# Patient Record
Sex: Male | Born: 1970 | Race: Black or African American | Hispanic: No | Marital: Married | Smoking: Never smoker
Health system: Southern US, Community
[De-identification: ages and names within clinical notes are randomized; demographics above are authoritative.]

## PROBLEM LIST (undated history)

## (undated) DIAGNOSIS — I428 Other cardiomyopathies: Secondary | ICD-10-CM

## (undated) DIAGNOSIS — R739 Hyperglycemia, unspecified: Secondary | ICD-10-CM

## (undated) DIAGNOSIS — E785 Hyperlipidemia, unspecified: Secondary | ICD-10-CM

## (undated) DIAGNOSIS — N182 Chronic kidney disease, stage 2 (mild): Secondary | ICD-10-CM

## (undated) HISTORY — DX: Chronic kidney disease, stage 2 (mild): N18.2

## (undated) HISTORY — DX: Hyperglycemia, unspecified: R73.9

## (undated) HISTORY — DX: Hyperlipidemia, unspecified: E78.5

## (undated) HISTORY — DX: Other cardiomyopathies: I42.8

---

## 2013-02-18 ENCOUNTER — Encounter (HOSPITAL_COMMUNITY): Payer: Self-pay | Admitting: Emergency Medicine

## 2013-02-18 ENCOUNTER — Emergency Department (HOSPITAL_COMMUNITY): Payer: Managed Care, Other (non HMO)

## 2013-02-18 ENCOUNTER — Emergency Department (HOSPITAL_COMMUNITY)
Admission: EM | Admit: 2013-02-18 | Discharge: 2013-02-18 | Disposition: A | Discharge status: Home / Self Care | Payer: Managed Care, Other (non HMO) | Attending: Emergency Medicine | Admitting: Emergency Medicine

## 2013-02-18 DIAGNOSIS — N289 Disorder of kidney and ureter, unspecified: Secondary | ICD-10-CM | POA: Insufficient documentation

## 2013-02-18 DIAGNOSIS — R079 Chest pain, unspecified: Secondary | ICD-10-CM

## 2013-02-18 LAB — POCT I-STAT TROPONIN I: Troponin i, poc: 0.01 ng/mL (ref 0.00–0.08)

## 2013-02-18 LAB — CBC WITH DIFFERENTIAL/PLATELET
Basophils Absolute: 0 10*3/uL (ref 0.0–0.1)
Lymphocytes Relative: 44 % (ref 12–46)
Lymphs Abs: 3.7 10*3/uL (ref 0.7–4.0)
Neutro Abs: 4 10*3/uL (ref 1.7–7.7)
Neutrophils Relative %: 47 % (ref 43–77)
Platelets: 316 10*3/uL (ref 150–400)
RBC: 5.38 MIL/uL (ref 4.22–5.81)
RDW: 12.5 % (ref 11.5–15.5)
WBC: 8.4 10*3/uL (ref 4.0–10.5)

## 2013-02-18 LAB — POCT I-STAT, CHEM 8
BUN: 12 mg/dL (ref 6–23)
Chloride: 102 mEq/L (ref 96–112)
Potassium: 4.9 mEq/L (ref 3.5–5.1)
Sodium: 140 mEq/L (ref 135–145)
TCO2: 28 mmol/L (ref 0–100)

## 2013-02-18 NOTE — ED Provider Notes (Addendum)
CSN: [id number]     Arrival date & time 02/18/13  1209 History     First MD Initiated Contact with Patient 02/18/13 1223     Chief Complaint  Patient presents with  . Chest Pain   (Consider location/radiation/quality/duration/timing/severity/associated sxs/prior Treatment) Patient is a 42 y.o. male presenting with chest pain. The history is provided by the patient.  Chest Pain  patient's been having episodes of sharp left-sided chest pain that lasted a few seconds for the last 3 days. It does not come on exertion. He initially states that he thought he pulled a muscle. He states that he was not able to reproduce it with exercise. No fevers. No cough.  No past medical history on file. No past surgical history on file. No family history on file. History  Substance Use Topics  . Smoking status: Not on file  . Smokeless tobacco: Not on file  . Alcohol Use: Not on file    Review of Systems  Cardiovascular: Positive for chest pain.    Allergies  Review of patient's allergies indicates no known allergies.  Home Medications   Current Outpatient Rx  Name  Route  Sig  Dispense  Refill  . Glucosamine-Chondroitin (OSTEO BI-FLEX REGULAR STRENGTH PO)   Oral   Take 2 capsules by mouth daily.          BP 129/85  Pulse 64  Temp(Src) 98.2 F (36.8 C) (Oral)  Resp 17  Ht 6' (1.829 m)  Wt 283 lb (128.368 kg)  BMI 38.37 kg/m2  SpO2 99% Physical Exam  Nursing note and vitals reviewed. Constitutional: He is oriented to person, place, and time. He appears well-developed and well-nourished.  HENT:  Head: Normocephalic and atraumatic.  Eyes: EOM are normal. Pupils are equal, round, and reactive to light.  Neck: Normal range of motion. Neck supple.  Cardiovascular: Normal rate, regular rhythm and normal heart sounds.   No murmur heard. Pulmonary/Chest: Effort normal and breath sounds normal. He exhibits tenderness.  Mild left-sided chest tenderness. No crepitus deformity. No rash.   Abdominal: Soft. Bowel sounds are normal. He exhibits no distension and no mass. There is no tenderness. There is no rebound and no guarding.  Musculoskeletal: Normal range of motion. He exhibits no edema.  Neurological: He is alert and oriented to person, place, and time. No cranial nerve deficit.  Skin: Skin is warm and dry.  Psychiatric: He has a normal mood and affect.    ED Course   Procedures (including critical care time)  Labs Reviewed  POCT I-STAT, CHEM 8 - Abnormal; Notable for the following:    Creatinine, Ser 1.40 (*)    Glucose, Bld 101 (*)    All other components within normal limits  CBC WITH DIFFERENTIAL  POCT I-STAT TROPONIN I   Dg Chest 2 View  02/18/2013   *RADIOLOGY REPORT*  Clinical Data: 42 year old male with chest pain  CHEST - 2 VIEW  Comparison: None  Findings: The cardiomediastinal silhouette is unremarkable. There is no evidence of focal airspace disease, pulmonary edema, suspicious pulmonary nodule/mass, pleural effusion, or pneumothorax. No acute bony abnormalities are identified.  IMPRESSION: No evidence of active cardiopulmonary disease.   Original Report Authenticated By: Harmon Pier, M.D.   1. Chest pain   2. Renal insufficiency    Date: 02/18/2013  Rate: 70  Rhythm: normal sinus rhythm  QRS Axis: normal  Intervals: normal  ST/T Wave abnormalities: normal  Conduction Disutrbances: none  Narrative Interpretation: unremarkable     MDM  Patient with episodes of sharp left-sided chest pain. Lasts a second or 2. EKG is normal and lab works reassuring. Doubt cardiac cause or pulmonary embolism. She does have mild renal sufficiency to be followed. Did have one episode where he felt lightheaded yesterday. Will likely need cardiology followup.  Juliet Rude. Rubin Payor, MD 02/18/13 1610  Juliet Rude. Rubin Payor, MD 03/05/13 1447

## 2013-02-18 NOTE — ED Notes (Signed)
Phlebotomy in the room

## 2013-02-18 NOTE — ED Notes (Signed)
Cp started on Friday sharp  Neck hurt no n/v/sob got lightheaded on Sunday went to dr today and  Sent to er for further tests

## 2013-02-18 NOTE — ED Notes (Signed)
Pt presents to the ED after being told by PCP to come here.  The patient reports that Friday, he began to feel sudden chest discomfort that radiated to his back and bilateral shoulders at rest.  Denies sob, n/v, diaphoresis at that time.  He reported another episode on Sunday where he felt suddenly lightheaded like he was going to pass out for about 15 seconds, pt was at rest and playing with his son at the time.  Pt denies any sob, n/v, diaphoresis at that time.  Pt is not experiencing any symptoms at this time.

## 2014-02-04 DIAGNOSIS — M19019 Primary osteoarthritis, unspecified shoulder: Secondary | ICD-10-CM | POA: Insufficient documentation

## 2014-02-04 DIAGNOSIS — M719 Bursopathy, unspecified: Secondary | ICD-10-CM

## 2014-02-04 DIAGNOSIS — M67919 Unspecified disorder of synovium and tendon, unspecified shoulder: Secondary | ICD-10-CM | POA: Insufficient documentation

## 2014-07-21 ENCOUNTER — Observation Stay (HOSPITAL_COMMUNITY)
Admission: EM | Admit: 2014-07-21 | Discharge: 2014-07-23 | Disposition: A | Discharge status: Home / Self Care | Payer: Managed Care, Other (non HMO) | Attending: Cardiovascular Disease | Admitting: Cardiovascular Disease

## 2014-07-21 ENCOUNTER — Encounter (HOSPITAL_COMMUNITY): Payer: Self-pay | Admitting: Family Medicine

## 2014-07-21 DIAGNOSIS — E785 Hyperlipidemia, unspecified: Secondary | ICD-10-CM | POA: Diagnosis present

## 2014-07-21 DIAGNOSIS — I429 Cardiomyopathy, unspecified: Secondary | ICD-10-CM | POA: Diagnosis not present

## 2014-07-21 DIAGNOSIS — R9431 Abnormal electrocardiogram [ECG] [EKG]: Secondary | ICD-10-CM | POA: Diagnosis present

## 2014-07-21 DIAGNOSIS — I428 Other cardiomyopathies: Secondary | ICD-10-CM

## 2014-07-21 DIAGNOSIS — I10 Essential (primary) hypertension: Secondary | ICD-10-CM | POA: Insufficient documentation

## 2014-07-21 DIAGNOSIS — Z7982 Long term (current) use of aspirin: Secondary | ICD-10-CM | POA: Insufficient documentation

## 2014-07-21 DIAGNOSIS — R079 Chest pain, unspecified: Secondary | ICD-10-CM | POA: Diagnosis present

## 2014-07-21 DIAGNOSIS — Z8249 Family history of ischemic heart disease and other diseases of the circulatory system: Secondary | ICD-10-CM | POA: Insufficient documentation

## 2014-07-21 LAB — I-STAT TROPONIN, ED: TROPONIN I, POC: 0 ng/mL (ref 0.00–0.08)

## 2014-07-21 LAB — BASIC METABOLIC PANEL
Anion gap: 8 (ref 5–15)
BUN: 11 mg/dL (ref 6–23)
CO2: 27 mmol/L (ref 19–32)
Calcium: 9.3 mg/dL (ref 8.4–10.5)
Chloride: 102 mEq/L (ref 96–112)
Creatinine, Ser: 1.34 mg/dL (ref 0.50–1.35)
GFR calc Af Amer: 74 mL/min — ABNORMAL LOW (ref >90)
GFR, EST NON AFRICAN AMERICAN: 64 mL/min — AB (ref >90)
GLUCOSE: 95 mg/dL (ref 70–99)
Potassium: 4.1 mmol/L (ref 3.5–5.1)
Sodium: 137 mmol/L (ref 135–145)

## 2014-07-21 LAB — CBC
HEMATOCRIT: 41.3 % (ref 39.0–52.0)
Hemoglobin: 13.6 g/dL (ref 13.0–17.0)
MCH: 27 pg (ref 26.0–34.0)
MCHC: 32.9 g/dL (ref 30.0–36.0)
MCV: 82.1 fL (ref 78.0–100.0)
Platelets: 339 10*3/uL (ref 150–400)
RBC: 5.03 MIL/uL (ref 4.22–5.81)
RDW: 12.8 % (ref 11.5–15.5)
WBC: 8.6 10*3/uL (ref 4.0–10.5)

## 2014-07-21 LAB — TROPONIN I: Troponin I: <0.03 ng/mL (ref <0.031)

## 2014-07-21 MED ORDER — SODIUM CHLORIDE 0.9 % IJ SOLN
3.0000 mL | Freq: Two times a day (BID) | INTRAMUSCULAR | Status: DC
Start: 2014-07-21 — End: 2014-07-22
  Administered 2014-07-22: 3 mL via INTRAVENOUS

## 2014-07-21 MED ORDER — SODIUM CHLORIDE 0.9 % IJ SOLN: 3.0000 mL | INTRAMUSCULAR | Status: DC | PRN

## 2014-07-21 MED ORDER — ACETAMINOPHEN 325 MG PO TABS: 650.0000 mg | ORAL_TABLET | ORAL | Status: DC | PRN

## 2014-07-21 MED ORDER — SODIUM CHLORIDE 0.9 % IV SOLN
INTRAVENOUS | Status: DC
  Administered 2014-07-22: 06:00:00 via INTRAVENOUS

## 2014-07-21 MED ORDER — ASPIRIN EC 81 MG PO TBEC
81.0000 mg | DELAYED_RELEASE_TABLET | Freq: Every day | ORAL | Status: DC
  Administered 2014-07-22: 81 mg via ORAL
  Filled 2014-07-21: qty 1

## 2014-07-21 MED ORDER — NITROGLYCERIN 0.4 MG SL SUBL: 0.4000 mg | SUBLINGUAL_TABLET | SUBLINGUAL | Status: DC | PRN

## 2014-07-21 MED ORDER — HEPARIN BOLUS VIA INFUSION
4000.0000 [IU] | Freq: Once | INTRAVENOUS | Status: AC
  Administered 2014-07-21: 4000 [IU] via INTRAVENOUS
  Filled 2014-07-21: qty 4000

## 2014-07-21 MED ORDER — SODIUM CHLORIDE 0.9 % IV SOLN: 250.0000 mL | INTRAVENOUS | Status: DC | PRN

## 2014-07-21 MED ORDER — ONDANSETRON HCL 4 MG/2ML IJ SOLN
4.0000 mg | Freq: Four times a day (QID) | INTRAMUSCULAR | Status: DC | PRN
Start: 2014-07-21 — End: 2014-07-22

## 2014-07-21 MED ORDER — HEPARIN (PORCINE) IN NACL 100-0.45 UNIT/ML-% IJ SOLN
1450.0000 [IU]/h | INTRAMUSCULAR | Status: DC
  Administered 2014-07-21 – 2014-07-22 (×2): 1450 [IU]/h via INTRAVENOUS
  Filled 2014-07-21 (×2): qty 250

## 2014-07-21 MED ORDER — ASPIRIN 81 MG PO CHEW
324.0000 mg | CHEWABLE_TABLET | Freq: Once | ORAL | Status: AC
  Administered 2014-07-21: 324 mg via ORAL
  Filled 2014-07-21: qty 4

## 2014-07-21 MED ORDER — ASPIRIN 81 MG PO CHEW: 324.0000 mg | CHEWABLE_TABLET | ORAL | Status: DC

## 2014-07-21 MED ORDER — ASPIRIN 81 MG PO CHEW
81.0000 mg | CHEWABLE_TABLET | ORAL | Status: AC
  Administered 2014-07-22: 81 mg via ORAL
  Filled 2014-07-21: qty 1

## 2014-07-21 MED ORDER — ASPIRIN 300 MG RE SUPP: 300.0000 mg | RECTAL | Status: DC

## 2014-07-21 NOTE — ED Notes (Addendum)
Pt presents from PCP office via [facility] with c/o dull intermittent CP x1 week. Pt reports he is recovering a recent cold/cough and saw his PCP to address the CP, EKG performed at the PCP office showed "Ischemic changes in the inferior leads" EMS reports their EKG did not show these changes. Pt denies CP at this time - pt states that pain is not made worse or better by anything. Pt A&Ox4 in NAD. Pt was given 3SL Nitro at PCP office.

## 2014-07-21 NOTE — ED Notes (Signed)
Patient now reporting mild discomfort to left chest, repeat EKG performed per Dr. Fayrene Fearing' request,

## 2014-07-21 NOTE — Progress Notes (Signed)
ANTICOAGULATION CONSULT NOTE - Initial Consult  Pharmacy Consult for Heparin Indication: ACS/STEMI  Allergies  Allergen Reactions  . Meloxicam Other (See Comments)    Stomach problems-gastritis  . Etodolac Nausea Only    Patient Measurements: Height: 6' (182.9 cm) Weight: 277 lb (125.646 kg) IBW/kg (Calculated) : 77.6 Heparin Dosing Weight: 106 kg  Vital Signs: Temp: 98.5 F (36.9 C) (01/18 1547) Temp Source: Oral (01/18 1547) BP: 112/67 mmHg (01/18 1645) Pulse Rate: 72 (01/18 1645)  Labs:  Recent Labs  07/21/14 1553  HGB 13.6  HCT 41.3  PLT 339  CREATININE 1.34    Estimated Creatinine Clearance: 98.3 mL/min (by C-G formula based on Cr of 1.34).   Medical History: Past Medical History  Diagnosis Date  . Hyperlipidemia     Medications:   (Not in a hospital admission) Scheduled:    Assessment:  78 YOM w/ PMH of HLD presenting to [facility] on 07/21/14 c/o intermittent CP ongoing for the past week.  Seen by PCP 07/21/14, EKG revealed ischemic changes and given 3 NTG via primary care. Pharmacy has been consulted to dose heparin for ACS/STEMI.  H/H wnl, plt wnl.  Troponin neg.  Scr wnl.  No prior anticoagulation use.    Goal of Therapy:  Heparin level 0.3-0.7 units/ml Monitor platelets by anticoagulation protocol: Yes   Plan:  - Heparin 4000 unit IV bolus x 1 - Heparin 1450 units/hr IV infusion - Follow up heparin level 6 hours after start of infusion - Daily CBC and Heparin level - Monitor signs and symptoms of bleeding  Red Christians, Pharm. D. Clinical Pharmacy Resident Pager: [phone] Ph: [phone] 07/21/2014 6:08 PM

## 2014-07-21 NOTE — ED Provider Notes (Signed)
Date: 07/21/2014  Rate: 75  Rhythm: normal sinus rhythm  QRS Axis: normal  Intervals: normal  ST/T Wave abnormalities: normal  Conduction Disutrbances:none  Narrative Interpretation:   Old EKG Reviewed: none available     Rolland Porter, MD 07/21/14 1552

## 2014-07-21 NOTE — Progress Notes (Signed)
Chaplain paged for AD. Philip Bruce given AD, nature of document explained to pt.   Gala Romney, Chaplain 07/21/2014

## 2014-07-21 NOTE — ED Provider Notes (Signed)
CSN: [id number]     Arrival date & time 07/21/14  1535 History   First MD Initiated Contact with Patient 07/21/14 1540     Chief Complaint  Patient presents with  . Chest Pain     (Consider location/radiation/quality/duration/timing/severity/associated sxs/prior Treatment) HPI Comments: Patient presents emergency department with chief complaint of chest pain. He states symptoms have been ongoing for the past week. They are intermittent. He describes pain as a dull ache. Denies any radiating symptoms to his arm or jaw. She denies any associated shortness of breath or exertional symptoms. Asian states that he was seen by his PCP today, and had an EKG, which showed ischemic changes in the inferior leads. The EKG changes have resolved in the emergency department. Patient is pain-free now. He was given 3 nitroglycerin by primary care. He denies any cardiac history. Denies history of hypertension, diabetes, hyperlipidemia, or smoking history. Denies any history of PE or DVT. Additionally, patient states that he has had an upper respiratory infection for the past couple of weeks. He states that he is finally starting to get over this.  The history is provided by the patient. No language interpreter was used.    Past Medical History  Diagnosis Date  . Hyperlipidemia    History reviewed. No pertinent past surgical history. No family history on file. History  Substance Use Topics  . Smoking status: Passive Smoke Exposure - Never Smoker  . Smokeless tobacco: Not on file  . Alcohol Use: Not on file    Review of Systems  Constitutional: Negative for fever and chills.  Respiratory: Negative for shortness of breath.   Cardiovascular: Positive for chest pain.  Gastrointestinal: Negative for nausea, vomiting, diarrhea and constipation.  Genitourinary: Negative for dysuria.  All other systems reviewed and are negative.     Allergies  Meloxicam and Etodolac  Home Medications   Prior to  Admission medications   Medication Sig Start Date End Date Taking? Authorizing Provider  etodolac (LODINE) 400 MG tablet Take 400 mg by mouth as needed for moderate pain.    Yes Historical Provider, MD  Protein POWD Take 1 application by mouth daily as needed (FOR WORKOUT).   Yes Historical Provider, MD   BP 121/72 mmHg  Pulse 73  Temp(Src) 98.5 F (36.9 C) (Oral)  Resp 18  Ht 6' (1.829 m)  Wt 277 lb (125.646 kg)  BMI 37.56 kg/m2  SpO2 97% Physical Exam  Constitutional: He is oriented to person, place, and time. He appears well-developed and well-nourished.  HENT:  Head: Normocephalic and atraumatic.  Eyes: Conjunctivae and EOM are normal. Pupils are equal, round, and reactive to light. Right eye exhibits no discharge. Left eye exhibits no discharge. No scleral icterus.  Neck: Normal range of motion. Neck supple. No JVD present.  Cardiovascular: Normal rate, regular rhythm and normal heart sounds.  Exam reveals no gallop and no friction rub.   No murmur heard. Pulmonary/Chest: Effort normal and breath sounds normal. No respiratory distress. He has no wheezes. He has no rales. He exhibits no tenderness.  Abdominal: Soft. He exhibits no distension and no mass. There is no tenderness. There is no rebound and no guarding.  Musculoskeletal: Normal range of motion. He exhibits no edema or tenderness.  Neurological: He is alert and oriented to person, place, and time.  Skin: Skin is warm and dry.  Psychiatric: He has a normal mood and affect. His behavior is normal. Judgment and thought content normal.  Nursing note and vitals reviewed.  ED Course  Procedures (including critical care time) Results for orders placed or performed during the hospital encounter of 07/21/14  CBC  Result Value Ref Range   WBC 8.6 4.0 - 10.5 K/uL   RBC 5.03 4.22 - 5.81 MIL/uL   Hemoglobin 13.6 13.0 - 17.0 g/dL   HCT 74.1 28.7 - 86.7 %   MCV 82.1 78.0 - 100.0 fL   MCH 27.0 26.0 - 34.0 pg   MCHC 32.9 30.0  - 36.0 g/dL   RDW 67.2 09.4 - 70.9 %   Platelets 339 150 - 400 K/uL  Basic metabolic panel  Result Value Ref Range   Sodium 137 135 - 145 mmol/L   Potassium 4.1 3.5 - 5.1 mmol/L   Chloride 102 96 - 112 mEq/L   CO2 27 19 - 32 mmol/L   Glucose, Bld 95 70 - 99 mg/dL   BUN 11 6 - 23 mg/dL   Creatinine, Ser 6.28 0.50 - 1.35 mg/dL   Calcium 9.3 8.4 - 36.6 mg/dL   GFR calc non Af Amer 64 (L) >90 mL/min   GFR calc Af Amer 74 (L) >90 mL/min   Anion gap 8 5 - 15  I-stat troponin, ED (not at [facility])  Result Value Ref Range   Troponin i, poc 0.00 0.00 - 0.08 ng/mL   Comment 3           No results found.    EKG Interpretation None     See attached note from Dr. Fayrene Fearing for EKG. MDM   Final diagnoses:  Chest pain, unspecified chest pain type    Patient with intermittent chest pain 1 week. EKG at PCP office shows ST depression in the inferior leads. This has resolved on our EKG in the emergency department. Given new dynamic EKG changes consider observation admission. Troponin is negative. Patient seen by and discussed with Dr. Fayrene Fearing, who agrees with the plan.  Patient discussed by Dr. Fayrene Fearing with cardiology, who recommends heparin and admission.  Cardiology to see patient.  Medications  heparin ADULT infusion 100 units/mL (25000 units/250 mL) (not administered)  heparin bolus via infusion 4,000 Units (not administered)  aspirin chewable tablet 324 mg (324 mg Oral Given 07/21/14 1619)    CRITICAL CARE Performed by: Roxy Horseman   Total critical care time: 35  Critical care time was exclusive of separately billable procedures and treating other patients.  Critical care was necessary to treat or prevent imminent or life-threatening deterioration.  Critical care was time spent personally by me on the following activities: development of treatment plan with patient and/or surrogate as well as nursing, discussions with consultants, evaluation of patient's response to treatment,  examination of patient, obtaining history from patient or surrogate, ordering and performing treatments and interventions, ordering and review of laboratory studies, ordering and review of radiographic studies, pulse oximetry and re-evaluation of patient's condition.    Roxy Horseman, PA-C 07/21/14 1753  Rolland Porter, MD 07/26/14 [phone]

## 2014-07-21 NOTE — ED Provider Notes (Signed)
Patient seen and evaluated. Care discussed with Ivar Drape PA. Patient reports intermittent episodes of "discomfort" in his left chest for the last several days. Describes as "small balloon blowing up in my chest and then deflating". No associated nausea shortness of breath no radiation neck back or jaw. EKG is primary care physician shows ST depressions and inverted T waves. Arrival EKG here shows only inverted T waves. He is symptom-free here and has a normal first troponin.  Discussed by myself with Dr. Thersa Salt. [name] He recommends heparin bolus and drip. Patient has been given aspirin. He remains symptom-free.  Rolland Porter, MD 07/21/14 1736

## 2014-07-21 NOTE — H&P (Signed)
Patient ID: Philip Bruce MRN: [id number], DOB/AGE: 09-04-1970   Admit date: 07/21/2014   Primary Physician: Pcp Not In System Primary Cardiologist: New (Dr. Eden Emms)  Pt. Profile: 44 y/o male with no prior cardiac history presenting to ED with intermittent CP x 1 week   Problem List  Past Medical History  Diagnosis Date  . Hyperlipidemia     History reviewed. No pertinent past surgical history.   Allergies  Allergies  Allergen Reactions  . Meloxicam Other (See Comments)    Stomach problems-gastritis  . Etodolac Nausea Only    HPI: The patient is a 44 year old African-American male, with no prior cardiac history, presenting to the [facility] emergency department for evaluation for chest pain. He denies any prior history of hypertension, diabetes or tobacco use. He reports prior history of hyperlipidemia but has been intolerant to statin therapy. He has been attempting to keep levels controlled through diet and exercise.  He notes a strong family history of CAD, noting myocardial infarction in his mother while in her 25s to 79s. He is followed by [facility]. He reports being seen by a cardiologist more than 15 years ago for evaluation of palpitations. He wore a heart monitor for a brief period of time but findings were benign.  He was in his usual state of health until one week ago. He developed URI symptoms consistent with a common cold. He noted  generalized malaise, fatigue and a productive cough but denied fever, chills, nausea or vomiting. It was during this time when he also developed intermittent left-sided chest pain radiating to his back. Described as a dull/pressure-like sensation. He denies radiation to his upper extremities, neck or jaw. No associated dyspnea, dizziness, lightheadedness, palpitations, syncope/near-syncope, nausea/vomiting. The pain has been slightly pleuritic, worse with deep breathing and coughing. He denies any exacerbation with palpation  or positional changes. His pain is not worsened by exertion. 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 He has not had any difficulties/limitations with this job duties due to increased pain. However,  due to ongoing symptoms, he presented to [facility] today for evaluation. While there, an EKG was obtained revealing inferior ST depressions. Interestingly, he reports that he was chest pain-free at the time the EKG was obtained. However, given his recent history of chest pain, he was subsequently referred to the ER for further evaluation. Repeat EKGs were obtained in the ED and previous EKG changes have resolved. There has been some questionable ST elevations, felt to be probable repolarization abnormalities. Vital signs including heart rate and blood pressure have remained stable. CBC and BMP are both unremarkable. Point of care troponin is negative. Chest x-ray not yet obtained.   Home Medications  Prior to Admission medications   Medication Sig Start Date End Date Taking? Authorizing Provider  etodolac (LODINE) 400 MG tablet Take 400 mg by mouth as needed for moderate pain.    Yes Historical Provider, MD  Protein POWD Take 1 application by mouth daily as needed (FOR WORKOUT).   Yes Historical Provider, MD    Family History  Family History  Problem Relation Age of Onset  . Coronary artery disease Mother 42    MI    Social History  History   Social History  . Marital Status: Married    Spouse Name: N/A    Number of Children: N/A  . Years of Education: N/A   Occupational History  . Not on file.   Social History Main  Topics  . Smoking status: Passive Smoke Exposure - Never Smoker  . Smokeless tobacco: Not on file  . Alcohol Use: Not on file  . Drug Use: Not on file  . Sexual Activity: Not on file   Other Topics Concern  . Not on file   Social History Narrative     Review of Systems General:  No chills, fever, night sweats or weight  changes.  Cardiovascular:  No chest pain, dyspnea on exertion, edema, orthopnea, palpitations, paroxysmal nocturnal dyspnea. Dermatological: No rash, lesions/masses Respiratory: No cough, dyspnea Urologic: No hematuria, dysuria Abdominal:   No nausea, vomiting, diarrhea, bright red blood per rectum, melena, or hematemesis Neurologic:  No visual changes, wkns, changes in mental status. All other systems reviewed and are otherwise negative except as noted above.  Physical Exam  Blood pressure 116/80, pulse 69, temperature 98.5 F (36.9 C), temperature source Oral, resp. rate 14, height 6' (1.829 m), weight 277 lb (125.646 kg), SpO2 97 %.  General: Pleasant, NAD Psych: Normal affect. Neuro: Alert and oriented X 3. Moves all extremities spontaneously. HEENT: Normal  Neck: Supple without bruits or JVD. Lungs:  Resp regular and unlabored, CTA. Heart: RRR no s3, s4, or murmurs. Abdomen: Soft, non-tender, non-distended, BS + x 4.  Extremities: No clubbing, cyanosis or edema. DP/PT/Radials 2+ and equal bilaterally.  Labs  Troponin [facility] of Care Test)  Recent Labs  07/21/14 1604  TROPIPOC 0.00   No results for input(s): CKTOTAL, CKMB, TROPONINI in the last 72 hours. Lab Results  Component Value Date   WBC 8.6 07/21/2014   HGB 13.6 07/21/2014   HCT 41.3 07/21/2014   MCV 82.1 07/21/2014   PLT 339 07/21/2014     Recent Labs Lab 07/21/14 1553  NA 137  K 4.1  CL 102  CO2 27  BUN 11  CREATININE 1.34  CALCIUM 9.3  GLUCOSE 95   No results found for: CHOL, HDL, LDLCALC, TRIG No results found for: DDIMER   Radiology/Studies  No results found.  ECG  Inferior ST depressions on earlier EKG. Most recent with NSR.   Echocardiogram      ASSESSMENT AND PLAN  Principal Problem:   Chest pain with moderate risk for cardiac etiology  1. Chest pain with moderate risk for cardiac etiology: Patient has had intermittent left-sided chest pain radiating to his back x 1 week.  Positive cardiac risk factors include a strong family history of CAD as well as a personal history of hyperlipidemia. EKG obtained at urgent care earlier today demonstrated ischemic changes in the inferior leads. The EKG changes have resolved in the ED. He is now chest pain-free. Given his EKG abnormalities, symptomatology and risk factors, we will admit and will plan for diagnostic left heart catheterization in the a.m. Will continue to cycle cardiac enzymes. Will place on IV heparin and aspirin. We will hold initiation of beta blocker therapy for now given his heart rate is in the 60s. Will make NPO starting at midnight.  2. History of hyperlipidemia: He reports he was on statin therapy over a year ago however this was discontinued due to intolerance. Since that time, he has attempted to control levels through diet and exercise. We'll obtain a fasting lipid panel in the a.m. to ensure that levels are adequately controlled.    Beau Fanny, PA-C 07/21/2014, 6:14 PM  Patient examined chart reviewed.  Symptoms are somewhat atypical beginning with URI but persistant with no focal infectious signs.  ECG from urgent care worrisome  with focal inferior T wave inversions in 2,3, F that are transient with his pain.  Recurrent pain in ER.  Elevated lipids and family history  Discussed options and feel cath is indicated.  Start heparin ASA.  Risks including stoke bleeding and MI discussed willing to proceed.  Orders written and lab notified.   Exam remarkable for overweight black male.  Some right shoulder rotator cuff pain. No rub distant heart sounds  Charlton Haws

## 2014-07-21 NOTE — ED Notes (Signed)
Attempted report 

## 2014-07-22 ENCOUNTER — Encounter (HOSPITAL_COMMUNITY)
Admission: EM | Disposition: A | Discharge status: Home / Self Care | Payer: Self-pay | Source: Home / Self Care | Attending: Emergency Medicine

## 2014-07-22 ENCOUNTER — Encounter (HOSPITAL_COMMUNITY): Payer: Self-pay | Admitting: Cardiology

## 2014-07-22 DIAGNOSIS — I200 Unstable angina: Secondary | ICD-10-CM

## 2014-07-22 DIAGNOSIS — R079 Chest pain, unspecified: Secondary | ICD-10-CM | POA: Diagnosis present

## 2014-07-22 DIAGNOSIS — E785 Hyperlipidemia, unspecified: Secondary | ICD-10-CM | POA: Diagnosis present

## 2014-07-22 HISTORY — PX: LEFT HEART CATHETERIZATION WITH CORONARY ANGIOGRAM: SHX5451

## 2014-07-22 LAB — PROTIME-INR
INR: 1.11 (ref 0.00–1.49)
PROTHROMBIN TIME: 14.4 s (ref 11.6–15.2)

## 2014-07-22 LAB — CBC
HCT: 43.5 % (ref 39.0–52.0)
Hemoglobin: 14.2 g/dL (ref 13.0–17.0)
MCH: 27.3 pg (ref 26.0–34.0)
MCHC: 32.6 g/dL (ref 30.0–36.0)
MCV: 83.7 fL (ref 78.0–100.0)
Platelets: 352 10*3/uL (ref 150–400)
RBC: 5.2 MIL/uL (ref 4.22–5.81)
RDW: 12.8 % (ref 11.5–15.5)
WBC: 10.6 10*3/uL — AB (ref 4.0–10.5)

## 2014-07-22 LAB — BASIC METABOLIC PANEL
Anion gap: 3 — ABNORMAL LOW (ref 5–15)
BUN: 14 mg/dL (ref 6–23)
CHLORIDE: 99 meq/L (ref 96–112)
CO2: 33 mmol/L — AB (ref 19–32)
CREATININE: 1.27 mg/dL (ref 0.50–1.35)
Calcium: 8.8 mg/dL (ref 8.4–10.5)
GFR calc non Af Amer: 68 mL/min — ABNORMAL LOW (ref >90)
GFR, EST AFRICAN AMERICAN: 79 mL/min — AB (ref >90)
Glucose, Bld: 121 mg/dL — ABNORMAL HIGH (ref 70–99)
Potassium: 3.4 mmol/L — ABNORMAL LOW (ref 3.5–5.1)
SODIUM: 135 mmol/L (ref 135–145)

## 2014-07-22 LAB — TROPONIN I: Troponin I: <0.03 ng/mL (ref <0.031)

## 2014-07-22 LAB — LIPID PANEL
CHOLESTEROL: 231 mg/dL — AB (ref 0–200)
HDL: 38 mg/dL — ABNORMAL LOW (ref >39)
LDL Cholesterol: 125 mg/dL — ABNORMAL HIGH (ref 0–99)
TRIGLYCERIDES: 338 mg/dL — AB (ref <150)
Total CHOL/HDL Ratio: 6.1 RATIO
VLDL: 68 mg/dL — ABNORMAL HIGH (ref 0–40)

## 2014-07-22 LAB — HEPARIN LEVEL (UNFRACTIONATED)
HEPARIN UNFRACTIONATED: 0.36 [IU]/mL (ref 0.30–0.70)
Heparin Unfractionated: 0.42 IU/mL (ref 0.30–0.70)

## 2014-07-22 SURGERY — LEFT HEART CATHETERIZATION WITH CORONARY ANGIOGRAM

## 2014-07-22 MED ORDER — NITROGLYCERIN 1 MG/10 ML FOR IR/CATH LAB
INTRA_ARTERIAL | Status: AC
  Filled 2014-07-22: qty 10

## 2014-07-22 MED ORDER — POTASSIUM CHLORIDE CRYS ER 20 MEQ PO TBCR
40.0000 meq | EXTENDED_RELEASE_TABLET | Freq: Once | ORAL | Status: AC
  Administered 2014-07-22: 40 meq via ORAL
  Filled 2014-07-22: qty 2

## 2014-07-22 MED ORDER — HEPARIN (PORCINE) IN NACL 2-0.9 UNIT/ML-% IJ SOLN
INTRAMUSCULAR | Status: AC
  Filled 2014-07-22: qty 1000

## 2014-07-22 MED ORDER — HEPARIN SODIUM (PORCINE) 5000 UNIT/ML IJ SOLN
5000.0000 [IU] | Freq: Three times a day (TID) | INTRAMUSCULAR | Status: DC
  Administered 2014-07-22 – 2014-07-23 (×2): 5000 [IU] via SUBCUTANEOUS
  Filled 2014-07-22 (×2): qty 1

## 2014-07-22 MED ORDER — HEPARIN SODIUM (PORCINE) 1000 UNIT/ML IJ SOLN
INTRAMUSCULAR | Status: AC
  Filled 2014-07-22: qty 1

## 2014-07-22 MED ORDER — LIDOCAINE HCL (PF) 1 % IJ SOLN
INTRAMUSCULAR | Status: AC
  Filled 2014-07-22: qty 30

## 2014-07-22 MED ORDER — FENTANYL CITRATE 0.05 MG/ML IJ SOLN
INTRAMUSCULAR | Status: AC
  Filled 2014-07-22: qty 2

## 2014-07-22 MED ORDER — SODIUM CHLORIDE 0.9 % IV SOLN
1.0000 mL/kg/h | INTRAVENOUS | Status: AC
  Administered 2014-07-22: 1 mL/kg/h via INTRAVENOUS

## 2014-07-22 MED ORDER — MIDAZOLAM HCL 2 MG/2ML IJ SOLN
INTRAMUSCULAR | Status: AC
  Filled 2014-07-22: qty 2

## 2014-07-22 MED ORDER — VERAPAMIL HCL 2.5 MG/ML IV SOLN
INTRAVENOUS | Status: AC
  Filled 2014-07-22: qty 2

## 2014-07-22 MED ORDER — MORPHINE SULFATE 2 MG/ML IJ SOLN: 2.0000 mg | INTRAMUSCULAR | Status: DC | PRN

## 2014-07-22 NOTE — Research (Signed)
Bioflow Study Informed Consent   Subject Name: Philip Bruce  Subject met inclusion and exclusion criteria.  The informed consent form, study requirements and expectations were reviewed with the subject and questions and concerns were addressed prior to the signing of the consent form.  The subject verbalized understanding of the trail requirements.  The subject agreed to participate in the Bioflow trial and signed the informed consent.  The informed consent was obtained prior to performance of any protocol-specific procedures for the subject.  A copy of the signed informed consent was given to the subject and a copy was placed in the subject's medical record.  Sandie Ano 07/22/2014, 9:12

## 2014-07-22 NOTE — H&P (View-Only) (Signed)
  DAILY PROGRESS NOTE  Subjective:  No further chest pain overnight. Troponin negative x 3. Plan for LHC today. Cholesterol is markedly elevated.  Potassium is low today at 3.4.  Objective:  Temp:  [97.8 F (36.6 C)-98.5 F (36.9 C)] 97.8 F (36.6 C) (01/19 0555) Pulse Rate:  [53-74] 53 (01/19 0555) Resp:  [12-20] 18 (01/19 0555) BP: (108-128)/(67-95) 108/76 mmHg (01/19 0555) SpO2:  [92 %-100 %] 99 % (01/19 0555) Weight:  [276 lb 3.2 oz (125.283 kg)-277 lb (125.646 kg)] 276 lb 11.2 oz (125.51 kg) (01/19 0555) Weight change:   Intake/Output from previous day:    Intake/Output from this shift: Total I/O In: 3 [I.V.:3] Out: -   Medications: Current Facility-Administered Medications  Medication Dose Route Frequency Provider Last Rate Last Dose  . 0.9 %  sodium chloride infusion  250 mL Intravenous PRN Brittainy M Simmons, PA-C      . 0.9 %  sodium chloride infusion   Intravenous Continuous Brittainy M Simmons, PA-C 100 mL/hr at 07/22/14 0530    . acetaminophen (TYLENOL) tablet 650 mg  650 mg Oral Q4H PRN Brittainy M Simmons, PA-C      . aspirin chewable tablet 324 mg  324 mg Oral NOW Brittainy M Simmons, PA-C   324 mg at 07/21/14 2000   Or  . aspirin suppository 300 mg  300 mg Rectal NOW Brittainy M Simmons, PA-C      . aspirin EC tablet 81 mg  81 mg Oral Daily Brittainy M Simmons, PA-C   81 mg at 07/22/14 0847  . heparin ADULT infusion 100 units/mL (25000 units/250 mL)  1,450 Units/hr Intravenous Continuous Samson C Lee, RPH 14.5 mL/hr at 07/22/14 0847 1,450 Units/hr at 07/22/14 0847  . nitroGLYCERIN (NITROSTAT) SL tablet 0.4 mg  0.4 mg Sublingual Q5 Min x 3 PRN Brittainy M Simmons, PA-C      . ondansetron (ZOFRAN) injection 4 mg  4 mg Intravenous Q6H PRN Brittainy M Simmons, PA-C      . sodium chloride 0.9 % injection 3 mL  3 mL Intravenous Q12H Brittainy M Simmons, PA-C   3 mL at 07/22/14 0848  . sodium chloride 0.9 % injection 3 mL  3 mL Intravenous PRN Brittainy M  Simmons, PA-C        Physical Exam: General appearance: alert and no distress Lungs: clear to auscultation bilaterally Heart: regular rate and rhythm, S1, S2 normal, no murmur, click, rub or gallop Extremities: extremities normal, atraumatic, no cyanosis or edema  Lab Results: Results for orders placed or performed during the hospital encounter of 07/21/14 (from the past 48 hour(s))  CBC     Status: None   Collection Time: 07/21/14  3:53 PM  Result Value Ref Range   WBC 8.6 4.0 - 10.5 K/uL   RBC 5.03 4.22 - 5.81 MIL/uL   Hemoglobin 13.6 13.0 - 17.0 g/dL   HCT 41.3 39.0 - 52.0 %   MCV 82.1 78.0 - 100.0 fL   MCH 27.0 26.0 - 34.0 pg   MCHC 32.9 30.0 - 36.0 g/dL   RDW 12.8 11.5 - 15.5 %   Platelets 339 150 - 400 K/uL  Basic metabolic panel     Status: Abnormal   Collection Time: 07/21/14  3:53 PM  Result Value Ref Range   Sodium 137 135 - 145 mmol/L    Comment: Please note change in reference range.   Potassium 4.1 3.5 - 5.1 mmol/L    Comment: Please note change in reference   range.   Chloride 102 96 - 112 mEq/L   CO2 27 19 - 32 mmol/L   Glucose, Bld 95 70 - 99 mg/dL   BUN 11 6 - 23 mg/dL   Creatinine, Ser 1.34 0.50 - 1.35 mg/dL   Calcium 9.3 8.4 - 10.5 mg/dL   GFR calc non Af Amer 64 (L) >90 mL/min   GFR calc Af Amer 74 (L) >90 mL/min    Comment: (NOTE) The eGFR has been calculated using the CKD EPI equation. This calculation has not been validated in all clinical situations. eGFR's persistently <90 mL/min signify possible Chronic Kidney Disease.    Anion gap 8 5 - 15  I-stat troponin, ED (not at [facility])     Status: None   Collection Time: 07/21/14  4:04 PM  Result Value Ref Range   Troponin i, poc 0.00 0.00 - 0.08 ng/mL   Comment 3            Comment: Due to the release kinetics of cTnI, a negative result within the first hours of the onset of symptoms does not rule out myocardial infarction with certainty. If myocardial infarction is still suspected, repeat the test  at appropriate intervals.   Troponin I (q 6hr x 3)     Status: None   Collection Time: 07/21/14  6:50 PM  Result Value Ref Range   Troponin I <0.03 <0.031 ng/mL    Comment:        NO INDICATION OF MYOCARDIAL INJURY. Please note change in reference range.   Heparin level (unfractionated)     Status: None   Collection Time: 07/22/14 12:32 AM  Result Value Ref Range   Heparin Unfractionated 0.36 0.30 - 0.70 IU/mL    Comment:        IF HEPARIN RESULTS ARE BELOW EXPECTED VALUES, AND PATIENT DOSAGE HAS BEEN CONFIRMED, SUGGEST FOLLOW UP TESTING OF ANTITHROMBIN III LEVELS.   CBC     Status: Abnormal   Collection Time: 07/22/14 12:32 AM  Result Value Ref Range   WBC 10.6 (H) 4.0 - 10.5 K/uL   RBC 5.20 4.22 - 5.81 MIL/uL   Hemoglobin 14.2 13.0 - 17.0 g/dL   HCT 43.5 39.0 - 52.0 %   MCV 83.7 78.0 - 100.0 fL   MCH 27.3 26.0 - 34.0 pg   MCHC 32.6 30.0 - 36.0 g/dL   RDW 12.8 11.5 - 15.5 %   Platelets 352 150 - 400 K/uL  Troponin I (q 6hr x 3)     Status: None   Collection Time: 07/22/14 12:32 AM  Result Value Ref Range   Troponin I <0.03 <0.031 ng/mL    Comment:        NO INDICATION OF MYOCARDIAL INJURY. Please note change in reference range.   Protime-INR     Status: None   Collection Time: 07/22/14 12:32 AM  Result Value Ref Range   Prothrombin Time 14.4 11.6 - 15.2 seconds   INR 1.11 0.00 - 1.49  Basic metabolic panel     Status: Abnormal   Collection Time: 07/22/14 12:32 AM  Result Value Ref Range   Sodium 135 135 - 145 mmol/L    Comment: Please note change in reference range.   Potassium 3.4 (L) 3.5 - 5.1 mmol/L    Comment: Please note change in reference range. DELTA CHECK NOTED    Chloride 99 96 - 112 mEq/L   CO2 33 (H) 19 - 32 mmol/L   Glucose, Bld 121 (H) 70 -   99 mg/dL   BUN 14 6 - 23 mg/dL   Creatinine, Ser 1.27 0.50 - 1.35 mg/dL   Calcium 8.8 8.4 - 10.5 mg/dL   GFR calc non Af Amer 68 (L) >90 mL/min   GFR calc Af Amer 79 (L) >90 mL/min    Comment:  (NOTE) The eGFR has been calculated using the CKD EPI equation. This calculation has not been validated in all clinical situations. eGFR's persistently <90 mL/min signify possible Chronic Kidney Disease.    Anion gap 3 (L) 5 - 15  Lipid panel     Status: Abnormal   Collection Time: 07/22/14 12:32 AM  Result Value Ref Range   Cholesterol 231 (H) 0 - 200 mg/dL   Triglycerides 338 (H) <150 mg/dL   HDL 38 (L) >39 mg/dL   Total CHOL/HDL Ratio 6.1 RATIO   VLDL 68 (H) 0 - 40 mg/dL   LDL Cholesterol 125 (H) 0 - 99 mg/dL    Comment:        Total Cholesterol/HDL:CHD Risk Coronary Heart Disease Risk Table                     Men   Women  1/2 Average Risk   3.4   3.3  Average Risk       5.0   4.4  2 X Average Risk   9.6   7.1  3 X Average Risk  23.4   11.0        Use the calculated Patient Ratio above and the CHD Risk Table to determine the patient's CHD Risk.        ATP III CLASSIFICATION (LDL):  <100     mg/dL   Optimal  100-129  mg/dL   Near or Above                    Optimal  130-159  mg/dL   Borderline  160-189  mg/dL   High  >190     mg/dL   Very High   Troponin I (q 6hr x 3)     Status: None   Collection Time: 07/22/14  6:19 AM  Result Value Ref Range   Troponin I <0.03 <0.031 ng/mL    Comment:        NO INDICATION OF MYOCARDIAL INJURY. Please note change in reference range.   Heparin level (unfractionated)     Status: None   Collection Time: 07/22/14  6:19 AM  Result Value Ref Range   Heparin Unfractionated 0.42 0.30 - 0.70 IU/mL    Comment:        IF HEPARIN RESULTS ARE BELOW EXPECTED VALUES, AND PATIENT DOSAGE HAS BEEN CONFIRMED, SUGGEST FOLLOW UP TESTING OF ANTITHROMBIN III LEVELS.     Imaging: No results found.  Assessment:  Principal Problem:   Chest pain with moderate risk for cardiac etiology Active Problems:   Dyslipidemia   Plan:  1. No further chest pain overnight. Troponin negative x 3. 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 May need to reconsider statin, zetia or other options for treatment, mostly dependant on cath findings. Potassium repleted today.  Time Spent Directly with Patient:  15 minutes  Length of Stay:  LOS: 1 day   Pixie Casino, MD, [facility] Attending Cardiologist [facility]  HILTY,Kenneth C 07/22/2014, 10:17 AM

## 2014-07-22 NOTE — Progress Notes (Signed)
UR Completed.  [phone]  

## 2014-07-22 NOTE — Progress Notes (Signed)
  DAILY PROGRESS NOTE  Subjective:  No further chest pain overnight. Troponin negative x 3. Plan for LHC today. Cholesterol is markedly elevated.  Potassium is low today at 3.4.  Objective:  Temp:  [97.8 F (36.6 C)-98.5 F (36.9 C)] 97.8 F (36.6 C) (01/19 0555) Pulse Rate:  [53-74] 53 (01/19 0555) Resp:  [12-20] 18 (01/19 0555) BP: (108-128)/(67-95) 108/76 mmHg (01/19 0555) SpO2:  [92 %-100 %] 99 % (01/19 0555) Weight:  [276 lb 3.2 oz (125.283 kg)-277 lb (125.646 kg)] 276 lb 11.2 oz (125.51 kg) (01/19 0555) Weight change:   Intake/Output from previous day:    Intake/Output from this shift: Total I/O In: 3 [I.V.:3] Out: -   Medications: Current Facility-Administered Medications  Medication Dose Route Frequency Provider Last Rate Last Dose  . 0.9 %  sodium chloride infusion  250 mL Intravenous PRN Brittainy M Simmons, PA-C      . 0.9 %  sodium chloride infusion   Intravenous Continuous Brittainy M Simmons, PA-C 100 mL/hr at 07/22/14 0530    . acetaminophen (TYLENOL) tablet 650 mg  650 mg Oral Q4H PRN Brittainy M Simmons, PA-C      . aspirin chewable tablet 324 mg  324 mg Oral NOW Brittainy M Simmons, PA-C   324 mg at 07/21/14 2000   Or  . aspirin suppository 300 mg  300 mg Rectal NOW Brittainy M Simmons, PA-C      . aspirin EC tablet 81 mg  81 mg Oral Daily Brittainy M Simmons, PA-C   81 mg at 07/22/14 0847  . heparin ADULT infusion 100 units/mL (25000 units/250 mL)  1,450 Units/hr Intravenous Continuous Samson C Lee, RPH 14.5 mL/hr at 07/22/14 0847 1,450 Units/hr at 07/22/14 0847  . nitroGLYCERIN (NITROSTAT) SL tablet 0.4 mg  0.4 mg Sublingual Q5 Min x 3 PRN Brittainy M Simmons, PA-C      . ondansetron (ZOFRAN) injection 4 mg  4 mg Intravenous Q6H PRN Brittainy M Simmons, PA-C      . sodium chloride 0.9 % injection 3 mL  3 mL Intravenous Q12H Brittainy M Simmons, PA-C   3 mL at 07/22/14 0848  . sodium chloride 0.9 % injection 3 mL  3 mL Intravenous PRN Brittainy M  Simmons, PA-C        Physical Exam: General appearance: alert and no distress Lungs: clear to auscultation bilaterally Heart: regular rate and rhythm, S1, S2 normal, no murmur, click, rub or gallop Extremities: extremities normal, atraumatic, no cyanosis or edema  Lab Results: Results for orders placed or performed during the hospital encounter of 07/21/14 (from the past 48 hour(s))  CBC     Status: None   Collection Time: 07/21/14  3:53 PM  Result Value Ref Range   WBC 8.6 4.0 - 10.5 K/uL   RBC 5.03 4.22 - 5.81 MIL/uL   Hemoglobin 13.6 13.0 - 17.0 g/dL   HCT 41.3 39.0 - 52.0 %   MCV 82.1 78.0 - 100.0 fL   MCH 27.0 26.0 - 34.0 pg   MCHC 32.9 30.0 - 36.0 g/dL   RDW 12.8 11.5 - 15.5 %   Platelets 339 150 - 400 K/uL  Basic metabolic panel     Status: Abnormal   Collection Time: 07/21/14  3:53 PM  Result Value Ref Range   Sodium 137 135 - 145 mmol/L    Comment: Please note change in reference range.   Potassium 4.1 3.5 - 5.1 mmol/L    Comment: Please note change in reference   range.   Chloride 102 96 - 112 mEq/L   CO2 27 19 - 32 mmol/L   Glucose, Bld 95 70 - 99 mg/dL   BUN 11 6 - 23 mg/dL   Creatinine, Ser 1.34 0.50 - 1.35 mg/dL   Calcium 9.3 8.4 - 10.5 mg/dL   GFR calc non Af Amer 64 (L) >90 mL/min   GFR calc Af Amer 74 (L) >90 mL/min    Comment: (NOTE) The eGFR has been calculated using the CKD EPI equation. This calculation has not been validated in all clinical situations. eGFR's persistently <90 mL/min signify possible Chronic Kidney Disease.    Anion gap 8 5 - 15  I-stat troponin, ED (not at [facility])     Status: None   Collection Time: 07/21/14  4:04 PM  Result Value Ref Range   Troponin i, poc 0.00 0.00 - 0.08 ng/mL   Comment 3            Comment: Due to the release kinetics of cTnI, a negative result within the first hours of the onset of symptoms does not rule out myocardial infarction with certainty. If myocardial infarction is still suspected, repeat the test  at appropriate intervals.   Troponin I (q 6hr x 3)     Status: None   Collection Time: 07/21/14  6:50 PM  Result Value Ref Range   Troponin I <0.03 <0.031 ng/mL    Comment:        NO INDICATION OF MYOCARDIAL INJURY. Please note change in reference range.   Heparin level (unfractionated)     Status: None   Collection Time: 07/22/14 12:32 AM  Result Value Ref Range   Heparin Unfractionated 0.36 0.30 - 0.70 IU/mL    Comment:        IF HEPARIN RESULTS ARE BELOW EXPECTED VALUES, AND PATIENT DOSAGE HAS BEEN CONFIRMED, SUGGEST FOLLOW UP TESTING OF ANTITHROMBIN III LEVELS.   CBC     Status: Abnormal   Collection Time: 07/22/14 12:32 AM  Result Value Ref Range   WBC 10.6 (H) 4.0 - 10.5 K/uL   RBC 5.20 4.22 - 5.81 MIL/uL   Hemoglobin 14.2 13.0 - 17.0 g/dL   HCT 43.5 39.0 - 52.0 %   MCV 83.7 78.0 - 100.0 fL   MCH 27.3 26.0 - 34.0 pg   MCHC 32.6 30.0 - 36.0 g/dL   RDW 12.8 11.5 - 15.5 %   Platelets 352 150 - 400 K/uL  Troponin I (q 6hr x 3)     Status: None   Collection Time: 07/22/14 12:32 AM  Result Value Ref Range   Troponin I <0.03 <0.031 ng/mL    Comment:        NO INDICATION OF MYOCARDIAL INJURY. Please note change in reference range.   Protime-INR     Status: None   Collection Time: 07/22/14 12:32 AM  Result Value Ref Range   Prothrombin Time 14.4 11.6 - 15.2 seconds   INR 1.11 0.00 - 1.49  Basic metabolic panel     Status: Abnormal   Collection Time: 07/22/14 12:32 AM  Result Value Ref Range   Sodium 135 135 - 145 mmol/L    Comment: Please note change in reference range.   Potassium 3.4 (L) 3.5 - 5.1 mmol/L    Comment: Please note change in reference range. DELTA CHECK NOTED    Chloride 99 96 - 112 mEq/L   CO2 33 (H) 19 - 32 mmol/L   Glucose, Bld 121 (H) 70 -   99 mg/dL   BUN 14 6 - 23 mg/dL   Creatinine, Ser 1.27 0.50 - 1.35 mg/dL   Calcium 8.8 8.4 - 10.5 mg/dL   GFR calc non Af Amer 68 (L) >90 mL/min   GFR calc Af Amer 79 (L) >90 mL/min    Comment:  (NOTE) The eGFR has been calculated using the CKD EPI equation. This calculation has not been validated in all clinical situations. eGFR's persistently <90 mL/min signify possible Chronic Kidney Disease.    Anion gap 3 (L) 5 - 15  Lipid panel     Status: Abnormal   Collection Time: 07/22/14 12:32 AM  Result Value Ref Range   Cholesterol 231 (H) 0 - 200 mg/dL   Triglycerides 338 (H) <150 mg/dL   HDL 38 (L) >39 mg/dL   Total CHOL/HDL Ratio 6.1 RATIO   VLDL 68 (H) 0 - 40 mg/dL   LDL Cholesterol 125 (H) 0 - 99 mg/dL    Comment:        Total Cholesterol/HDL:CHD Risk Coronary Heart Disease Risk Table                     Men   Women  1/2 Average Risk   3.4   3.3  Average Risk       5.0   4.4  2 X Average Risk   9.6   7.1  3 X Average Risk  23.4   11.0        Use the calculated Patient Ratio above and the CHD Risk Table to determine the patient's CHD Risk.        ATP III CLASSIFICATION (LDL):  <100     mg/dL   Optimal  100-129  mg/dL   Near or Above                    Optimal  130-159  mg/dL   Borderline  160-189  mg/dL   High  >190     mg/dL   Very High   Troponin I (q 6hr x 3)     Status: None   Collection Time: 07/22/14  6:19 AM  Result Value Ref Range   Troponin I <0.03 <0.031 ng/mL    Comment:        NO INDICATION OF MYOCARDIAL INJURY. Please note change in reference range.   Heparin level (unfractionated)     Status: None   Collection Time: 07/22/14  6:19 AM  Result Value Ref Range   Heparin Unfractionated 0.42 0.30 - 0.70 IU/mL    Comment:        IF HEPARIN RESULTS ARE BELOW EXPECTED VALUES, AND PATIENT DOSAGE HAS BEEN CONFIRMED, SUGGEST FOLLOW UP TESTING OF ANTITHROMBIN III LEVELS.     Imaging: No results found.  Assessment:  Principal Problem:   Chest pain with moderate risk for cardiac etiology Active Problems:   Dyslipidemia   Plan:  1. No further chest pain overnight. Troponin negative x 3. 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 May need to reconsider statin, zetia or other options for treatment, mostly dependant on cath findings. Potassium repleted today.  Time Spent Directly with Patient:  15 minutes  Length of Stay:  LOS: 1 day   Pixie Casino, MD, [facility] Attending Cardiologist [facility]  Yexalen Deike C 07/22/2014, 10:17 AM

## 2014-07-22 NOTE — CV Procedure (Signed)
CARDIAC CATHETERIZATION REPORT  NAME:  Philip Bruce   MRN: [id number] DOB:  [birth year]   ADMIT DATE: 07/21/2014 Procedure Date: 07/22/2014  INTERVENTIONAL CARDIOLOGIST: Leonie Man, M.D., MS PRIMARY CARE PROVIDER: Pcp Not In System PRIMARY CARDIOLOGIST:  Jenkins Rouge, M.D.  PATIENT:  Philip Bruce is a 44 y.o. male with a history of hypertension and hyperlipidemia and strong family history for CAD who was admitted on January 18 worsening episodes of intermittent chest pain.  His mother had an MI in her Olevia Bowens to 69s. His symptoms noted as chest heaviness and pressure, associated with upper respiratory tract infection symptoms. EKG in the emergency room showed concerning for dynamic inferior T-wave inversions and therefore is referred for cardiac catheterization for possible unstable angina.  PRE-OPERATIVE DIAGNOSIS:    Chest Pain with Concern for Unstable Angina  PROCEDURES PERFORMED:    Left Heart Catheterization with Native Coronary Angiography  via Right Radial Artery   Left Ventriculography  PROCEDURE: The patient was brought to the 2nd [place] [facility] in the fasting state and prepped and draped in the usual sterile fashion for right radial artery access. A modified Allen's test was performed on the right wrist demonstrating excellent collateral flow for radial access.   Sterile technique was used including antiseptics, cap, gloves, gown, hand hygiene, mask and sheet. Skin prep: Chlorhexidine.   Consent: Risks of procedure as well as the alternatives and risks of each were explained to the (patient/caregiver). Consent for procedure obtained.   Time Out: Verified patient identification, verified procedure, site/side was marked, verified correct patient position, special equipment/implants available, medications/allergies/relevent history reviewed, required imaging and test results available. Performed.  Access:   Right Radial Artery: 6 Fr Sheath -   Seldinger Technique (Angiocath Micropuncture Kit)  Radial Cocktail - 10 mL; IV Heparin 6500 Units   Left Heart Catheterization: 5 Fr Catheters advanced or exchanged over a long exchange safety J-wire; TIG 4.0 catheter advanced first.  Left and Right Coronary Artery Cineangiography: TIG 4.0 Catheter   LV Hemodynamics (LV Gram): Angled pigtail  Sheath removed in the cardiac Cath Lab with TR band placement for hemostasis.  TR Band: 1300  Hours; 12 mL air  FINDINGS:  Hemodynamics:   Central Aortic Pressure / Mean: 107/72/87 mmHg  Left Ventricular Pressure / LVEDP: 111/0/11 mmHg  Left Ventriculography:  EF: Roughly 45 %  Wall Motion: Global hypokinesis, mild  Coronary Anatomy:  Dominance: Right  Left Main: Large-caliber vessel bifurcates into the LAD and Circumflex. Angiographically normal. LAD: Normal caliber vessel tapers down to the apex. It gives rise to a mid vessel diagonal branch. Both parent and daughter vessel are free of significant disease. The LAD does not reach around the apex.  Left Circumflex: Large-caliber, nondominant vessel. After giving off a small AV groove branch that terminates as a large lateral OM with a mid branch followed by distal bifurcation into 2 moderate caliber branches. Angiographically normal.    RCA: Normal caliber, dominant vessel that bifurcates distally into the Right Posterior Descending Artery (RPDA) and the Right Posterior AV Groove Branch (RPAV)  RPDA: Moderate caliber vessel that tapers to the apex. Angiographically normal.  RPL Sysytem:The RPAV moderate large-caliber vessel that essentially terminates as a large extensive posterolateral branch. Angiographic normal.  MEDICATIONS:  Anesthesia:  Local Lidocaine 2 ml  Sedation:  2 mg IV Versed, 50 mcg IV fentanyl ;   Omnipaque Contrast: 80 ml  Anticoagulation:  IV Heparin 6500 Units Radial Cocktail: 5 mg Verapamil, 400 mcg  NTG, 2 ml 2% Lidocaine in 10 ml NS  PATIENT DISPOSITION:     The patient was transferred to the PACU holding area in a hemodynamicaly stable, chest pain free condition.  The patient tolerated the procedure well, and there were no complications.  EBL:   < 10 ml  The patient was stable before, during, and after the procedure.  POST-OPERATIVE DIAGNOSIS:    Angiographically normal coronary arteries with mildly reduced EF  Normal LV filling pressures.  PLAN OF CARE:  Transfer back to nursing unit for post radial cath care  Check 2-D echocardiogram for better estimation of EF  Anticipate discharge later on this afternoon following echocardiogram as long as it is not grossly abnormal.    Sharia Averitt, Leonie Green, M.D., M.S. Interventional Cardiologist   Pager # [phone]

## 2014-07-22 NOTE — Progress Notes (Signed)
ANTICOAGULATION CONSULT NOTE - Follow Up Consult  Pharmacy Consult for heparin Indication: chest pain/ACS   Labs:  Recent Labs  07/21/14 1553 07/21/14 1850 07/22/14 0032  HGB 13.6  --  14.2  HCT 41.3  --  43.5  PLT 339  --  352  LABPROT  --   --  14.4  INR  --   --  1.11  HEPARINUNFRC  --   --  0.36  CREATININE 1.34  --   --   TROPONINI  --  <0.03  --     Assessment/Plan:  44yo male therapeutic on heparin with initial dosing for CP. Will continue gtt at current rate and confirm stable with additional level.   Vernard Gambles, PharmD, BCPS  07/22/2014,1:39 AM

## 2014-07-22 NOTE — Interval H&P Note (Signed)
History and Physical Interval Note:  07/22/2014 11:56 AM  Philip Bruce  has presented today for surgery, with the diagnosis of Chest Pain - Concerning for Unstable Angina.    The various methods of treatment have been discussed with the patient and family. After consideration of risks, benefits and other options for treatment, the patient has consented to  Procedure(s): LEFT HEART CATHETERIZATION WITH CORONARY ANGIOGRAM (N/A) as a surgical intervention .  The patient's history has been reviewed, patient examined, no change in status, stable for surgery.  I have reviewed the patient's chart and labs.  Questions were answered to the patient's satisfaction.    Cath Lab Visit (complete for each Cath Lab visit)  Clinical Evaluation Leading to the Procedure:   ACS: No. - Crescendo Angina  Non-ACS:    Anginal Classification: CCS II  Anti-ischemic medical therapy: No Therapy  Non-Invasive Test Results: No non-invasive testing performed  Prior CABG: No previous CABG   [name] W

## 2014-07-22 NOTE — Progress Notes (Signed)
ANTICOAGULATION CONSULT NOTE - Follow Up Consult  Pharmacy Consult for heparin Indication: chest pain/ACS  Allergies  Allergen Reactions  . Meloxicam Other (See Comments)    Stomach problems-gastritis  . Etodolac Nausea Only    Patient Measurements: Height: 6' (182.9 cm) Weight: 276 lb 11.2 oz (125.51 kg) IBW/kg (Calculated) : 77.6 Heparin Dosing Weight: 106 kg  Vital Signs: Temp: 97.8 F (36.6 C) (01/19 0555) Temp Source: Oral (01/19 0555) BP: 108/76 mmHg (01/19 0555) Pulse Rate: 53 (01/19 0555)  Labs:  Recent Labs  07/21/14 1553 07/21/14 1850 07/22/14 0032 07/22/14 0619  HGB 13.6  --  14.2  --   HCT 41.3  --  43.5  --   PLT 339  --  352  --   LABPROT  --   --  14.4  --   INR  --   --  1.11  --   HEPARINUNFRC  --   --  0.36 0.42  CREATININE 1.34  --  1.27  --   TROPONINI  --  <0.03 <0.03 <0.03    Estimated Creatinine Clearance: 103.7 mL/min (by C-G formula based on Cr of 1.27).   Medications:  Infusions:  . sodium chloride 100 mL/hr at 07/22/14 0530  . heparin 1,450 Units/hr (07/22/14 0847)    Assessment: 6 YOM w/ PMH of HLD presenting to [facility] on 07/21/14 c/o intermittent CP ongoing for the past week. Seen by PCP 07/21/14, EKG revealed ischemic changes and given 3 NTG via primary care. He continues on IV heparin with two therapeutic heparin levels. No bleeding noted, CBC is normal. Plan is for cath today.  Goal of Therapy:  Heparin level 0.3-0.7 units/ml Monitor platelets by anticoagulation protocol: Yes   Plan:  - Continue heparin drip at 1450 units/hr - Daily heparin level and CBC - Monitor for s/sx of bleeding - F/U after cath  [facility], Pharm.D., BCPS Clinical Pharmacist Pager: [phone] 07/22/2014 9:59 AM

## 2014-07-23 DIAGNOSIS — I429 Cardiomyopathy, unspecified: Secondary | ICD-10-CM

## 2014-07-23 DIAGNOSIS — I519 Heart disease, unspecified: Secondary | ICD-10-CM

## 2014-07-23 DIAGNOSIS — I428 Other cardiomyopathies: Secondary | ICD-10-CM

## 2014-07-23 LAB — CBC
HCT: 41.9 % (ref 39.0–52.0)
Hemoglobin: 13.8 g/dL (ref 13.0–17.0)
MCH: 26.8 pg (ref 26.0–34.0)
MCHC: 32.9 g/dL (ref 30.0–36.0)
MCV: 81.4 fL (ref 78.0–100.0)
Platelets: 347 10*3/uL (ref 150–400)
RBC: 5.15 MIL/uL (ref 4.22–5.81)
RDW: 12.8 % (ref 11.5–15.5)
WBC: 9.1 10*3/uL (ref 4.0–10.5)

## 2014-07-23 MED ORDER — ROSUVASTATIN CALCIUM 5 MG PO TABS: 5.0000 mg | ORAL_TABLET | Freq: Every day | ORAL | Status: DC

## 2014-07-23 MED ORDER — POTASSIUM CHLORIDE CRYS ER 20 MEQ PO TBCR
40.0000 meq | EXTENDED_RELEASE_TABLET | Freq: Once | ORAL | Status: AC
  Administered 2014-07-23: 40 meq via ORAL
  Filled 2014-07-23: qty 2

## 2014-07-23 MED ORDER — ROSUVASTATIN CALCIUM 10 MG PO TABS: 5.0000 mg | ORAL_TABLET | Freq: Every day | ORAL | Status: DC

## 2014-07-23 MED ORDER — LISINOPRIL 2.5 MG PO TABS: 2.5000 mg | ORAL_TABLET | Freq: Every day | ORAL | Status: DC

## 2014-07-23 MED ORDER — LISINOPRIL 2.5 MG PO TABS
2.5000 mg | ORAL_TABLET | Freq: Every day | ORAL | Status: DC
  Administered 2014-07-23: 2.5 mg via ORAL
  Filled 2014-07-23: qty 1

## 2014-07-23 NOTE — Discharge Summary (Signed)
Physician Discharge Summary     Cardiologist:  Nishan(new) Patient ID: Philip Bruce MRN: [id number] DOB/AGE: [birth year] 44 y.o.  Admit date: 07/21/2014 Discharge date: 07/23/2014  Admission Diagnoses:   Chest pain with moderate risk for cardiac etiology  Discharge Diagnoses:  Principal Problem:   Chest pain with moderate risk for cardiac etiology Active Problems:   Dyslipidemia   Chest Pain - concerning for Unstable angina   Chest pain   Nonischemic cardiomyopathy   Discharged Condition: stable  Hospital Course:   The patient is a 44 year old African-American male, with no prior cardiac history, presenting to the [facility] emergency department for evaluation for chest pain. He denies any prior history of hypertension, diabetes or tobacco use. He reports prior history of hyperlipidemia but has been intolerant to statin therapy. He has been attempting to keep levels controlled through diet and exercise. He notes a strong family history of CAD, noting myocardial infarction in his mother while in her 32s to 61s. He is followed by [facility]. He reports being seen by a cardiologist more than 15 years ago for evaluation of palpitations. He wore a heart monitor for a brief period of time but findings were benign.  He was in his usual state of health until one week ago. He developed URI symptoms consistent with a common cold. He noted generalized malaise, fatigue and a productive cough but denied fever, chills, nausea or vomiting. It was during this time when he also developed intermittent left-sided chest pain radiating to his back. Described as a dull/pressure-like sensation. He denies radiation to his upper extremities, neck or jaw. No associated dyspnea, dizziness, lightheadedness, palpitations, syncope/near-syncope, nausea/vomiting. The pain has been slightly pleuritic, worse with deep breathing and coughing. He denies any exacerbation with palpation or positional changes.  His pain is not worsened by exertion. 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 He has not had any difficulties/limitations with this job duties due to increased pain. However, due to ongoing symptoms, he presented to [facility] today for evaluation. While there, an EKG was obtained revealing inferior ST depressions. Interestingly, he reports that he was chest pain-free at the time the EKG was obtained. However, given his recent history of chest pain, he was subsequently referred to the ER for further evaluation. Repeat EKGs were obtained in the ED and previous EKG changes have resolved. There has been some questionable ST elevations, felt to be probable repolarization abnormalities. Vital signs including heart rate and blood pressure have remained stable. CBC and BMP are both unremarkable. Point of care troponin is negative. Chest x-ray not yet obtained.  He was admitted and placed on IV heparin and ASA.  He ruled out for MI. He underwent a left heart cath which revealed normal coronary arteries and filling pressures with an EF of 45%.  2D echo was also completed and revealed EF of 45-50%, diffuse hypokinesis, G1DD.   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  We will retry crestor 34m since his lipid panel is elevated.  Lisinopril 2.5 mg was added. Will keep out of work for one week then light duty until follow up.  The patient was seen by Dr. HDebara Pickettwho felt he was stable for DC home.     Consults: None  Significant Diagnostic Studies:  Lipid Panel     Component Value Date/Time   CHOL 231* 07/22/2014 0032   TRIG 338* 07/22/2014 0032   HDL 38* 07/22/2014 0032   CHOLHDL 6.1 07/22/2014  0032   VLDL 68* 07/22/2014 0032   LDLCALC 125* 07/22/2014 0032   Study Conclusions  - Left ventricle: The cavity size was normal. Systolic function was mildly reduced. The estimated ejection fraction was in the range of 45% to 50%. Diffuse hypokinesis. Doppler  parameters are consistent with abnormal left ventricular relaxation (grade 1 diastolic dysfunction). - Aortic valve: There was no stenosis. - Mitral valve: There was no significant regurgitation. - Right ventricle: The cavity size was normal. Systolic function was normal. - Right atrium: The atrium was mildly dilated. - Pulmonary arteries: No complete TR doppler jet so unable to estimate PA systolic pressure. - Inferior vena cava: The vessel was normal in size. The respirophasic diameter changes were in the normal range (>= 50%), consistent with normal central venous pressure.  Impressions:  - Normal LV size with mild diffuse hypokinesis, EF 45-50%. Normal RV size and systolic function. No significant valvular abnormalities.   CARDIAC CATHETERIZATION REPORT  NAME: Philip Bruce DOB: January 27, 1972ADMIT DATE: 07/21/2014 Procedure Date: 07/22/2014  INTERVENTIONAL CARDIOLOGIST: Leonie Man, M.D., MS PRIMARY CARE PROVIDER: Pcp Not In System PRIMARY CARDIOLOGIST: Jenkins Rouge, M.D.  PATIENT: Philip Bruce is a 44 y.o. male with a history of hypertension and hyperlipidemia and strong family history for CAD who was admitted on January 18 worsening episodes of intermittent chest pain. His mother had an MI in her Olevia Bowens to 18s. His symptoms noted as chest heaviness and pressure, associated with upper respiratory tract infection symptoms. EKG in the emergency room showed concerning for dynamic inferior T-wave inversions and therefore is referred for cardiac catheterization for possible unstable angina.  PRE-OPERATIVE DIAGNOSIS:   Chest Pain with Concern for Unstable Angina  PROCEDURES PERFORMED:   Left Heart Catheterization with Native Coronary Angiography via Right Radial Artery   Left Ventriculography  PROCEDURE: The patient was brought to the 2nd [facility]  [facility]atheter[facility] Lab in the fasting state and prepped and draped in the usual sterile fashion for right radial artery access. A modified Allen's test was performed on the right wrist demonstrating excellent collateral flow for radial access. Sterile technique was used including antiseptics, cap, gloves, gown, hand hygiene, mask and sheet. Skin prep: Chlorhexidine.   Consent: Risks of procedure as well as the alternatives and risks of each were explained to the (patient/caregiver). Consent for procedure obtained.   Time Out: Verified patient identification, verified procedure, site/side was marked, verified correct patient position, special equipment/implants available, medications/allergies/relevent history reviewed, required imaging and test results available. Performed.  Access:   Right Radial Artery: 6 Fr Sheath - Seldinger Technique (Angiocath Micropuncture Kit)  Radial Cocktail - 10 mL; IV Heparin 6500 Units    Left Heart Catheterization: 5 Fr Catheters advanced or exchanged over a long exchange safety J-wire; TIG 4.0 catheter advanced first.   Left and Right Coronary Artery Cineangiography: TIG 4.0 Catheter   LV Hemodynamics (LV Gram): Angled pigtail  Sheath removed in the cardiac Cath Lab with TR band placement for hemostasis.  TR Band: 1300 Hours; 12 mL air  FINDINGS:  Hemodynamics:   Central Aortic Pressure / Mean: 107/72/87 mmHg  Left Ventricular Pressure / LVEDP: 111/0/11 mmHg  Left Ventriculography:  EF: Roughly 45 %  Wall Motion: Global hypokinesis, mild  Coronary Anatomy:  Dominance: Right  Left Main: Large-caliber vessel bifurcates into the LAD and Circumflex. Angiographically normal. LAD: Normal caliber vessel tapers down to the apex. It gives rise to a mid vessel diagonal branch. Both parent and daughter vessel are free of  significant disease. The LAD does not reach around the apex.  Left Circumflex: Large-caliber, nondominant vessel. After  giving off a small AV groove branch that terminates as a large lateral OM with a mid branch followed by distal bifurcation into 2 moderate caliber branches. Angiographically normal.    RCA: Normal caliber, dominant vessel that bifurcates distally into the Right Posterior Descending Artery (RPDA) and the Right Posterior AV Groove Branch (RPAV)  RPDA: Moderate caliber vessel that tapers to the apex. Angiographically normal.  RPL Sysytem:The RPAV moderate large-caliber vessel that essentially terminates as a large extensive posterolateral branch. Angiographic normal.  MEDICATIONS:  Anesthesia: Local Lidocaine 2 ml  Sedation: 2 mg IV Versed, 50 mcg IV fentanyl ;   Omnipaque Contrast: 80 ml  Anticoagulation: IV Heparin 6500 Units  Radial Cocktail: 5 mg Verapamil, 400 mcg NTG, 2 ml 2% Lidocaine in 10 ml NS  PATIENT DISPOSITION:   The patient was transferred to the PACU holding area in a hemodynamicaly stable, chest pain free condition.  The patient tolerated the procedure well, and there were no complications. EBL:  < 10 ml  The patient was stable before, during, and after the procedure.  POST-OPERATIVE DIAGNOSIS:   Angiographically normal coronary arteries with mildly reduced EF  Normal LV filling pressures.  PLAN OF CARE:  Transfer back to nursing unit for post radial cath care  Check 2-D echocardiogram for better estimation of EF  Anticipate discharge later on this afternoon following echocardiogram as long as it is not grossly abnormal.    HARDING, Leonie Green, M.D., M.S. Interventional Cardiologist   Treatments:  See above  Discharge Exam: Blood pressure 123/83, pulse 70, temperature 98 F (36.7 C), temperature source Oral, resp. rate 16, height 6' (1.829 m), weight 275 lb (124.739 kg), SpO2 100 %.   Disposition: 01-Home or Self Care      Discharge Instructions    Diet - low sodium heart healthy    Complete by:  As directed      Discharge  instructions    Complete by:  As directed   Monitor your weight every morning.  If you gain 3 pounds in 24 hours, or 5 pounds in a week, call the office for instructions.     Increase activity slowly    Complete by:  As directed             Medication List    STOP taking these medications        etodolac 400 MG tablet  Commonly known as:  LODINE      TAKE these medications        lisinopril 2.5 MG tablet  Commonly known as:  PRINIVIL,ZESTRIL  Take 1 tablet (2.5 mg total) by mouth daily.     Protein Powd  Take 1 application by mouth daily as needed (FOR WORKOUT).     rosuvastatin 5 MG tablet  Commonly known as:  CRESTOR  Take 1 tablet (5 mg total) by mouth daily at 6 PM.  Notes to Patient:  Take a dose tonight       Follow-up Information    Follow up with Melina Copa, PA-C On 08/07/2014.   Specialty:  Cardiology   Why:  10:45 AM   Contact information:   [address] [place] [address] [phone]       Signed: Tarri Fuller, [facility] 07/23/2014, 4:01 PM

## 2014-07-23 NOTE — Progress Notes (Signed)
*  PRELIMINARY RESULTS* Echocardiogram 2D Echocardiogram has been performed.  Philip Bruce 07/23/2014, 10:44 AM

## 2014-07-23 NOTE — Progress Notes (Signed)
DAILY PROGRESS NOTE  Subjective:  No events overnight. No further chest pain. EF mildly reduced around 45%, normal coronaries on cath.  Cholesterol is elevated with LDL of 125.  Objective:  Temp:  [97.8 F (36.6 C)-98.5 F (36.9 C)] 97.8 F (36.6 C) (01/20 0427) Pulse Rate:  [57-75] 62 (01/20 0427) Resp:  [18] 18 (01/20 0427) BP: (103-132)/(72-88) 123/79 mmHg (01/20 0427) SpO2:  [99 %-100 %] 99 % (01/20 0427) Weight:  [275 lb (124.739 kg)] 275 lb (124.739 kg) (01/20 0427) Weight change: -2 lb (-0.907 kg)  Intake/Output from previous day: 01/19 0701 - 01/20 0700 In: 243 [P.O.:240; I.V.:3] Out: -   Intake/Output from this shift: Total I/O In: 240 [P.O.:240] Out: -    Medications: Current Facility-Administered Medications  Medication Dose Route Frequency Provider Last Rate Last Dose  . heparin injection 5,000 Units  5,000 Units Subcutaneous 3 times per day Leonie Man, MD   5,000 Units at 07/23/14 0541  . morphine 2 MG/ML injection 2 mg  2 mg Intravenous Q1H PRN Leonie Man, MD        Physical Exam: General appearance: alert and no distress Lungs: clear to auscultation bilaterally Heart: regular rate and rhythm, S1, S2 normal, no murmur, click, rub or gallop Extremities: extremities normal, atraumatic, no cyanosis or edema and radial cath site without hematoma, bruit or ecchymosis  Lab Results: Results for orders placed or performed during the hospital encounter of 07/21/14 (from the past 48 hour(s))  CBC     Status: None   Collection Time: 07/21/14  3:53 PM  Result Value Ref Range   WBC 8.6 4.0 - 10.5 K/uL   RBC 5.03 4.22 - 5.81 MIL/uL   Hemoglobin 13.6 13.0 - 17.0 g/dL   HCT 41.3 39.0 - 52.0 %   MCV 82.1 78.0 - 100.0 fL   MCH 27.0 26.0 - 34.0 pg   MCHC 32.9 30.0 - 36.0 g/dL   RDW 12.8 11.5 - 15.5 %   Platelets 339 150 - 400 K/uL  Basic metabolic panel     Status: Abnormal   Collection Time: 07/21/14  3:53 PM  Result Value Ref Range   Sodium 137  135 - 145 mmol/L    Comment: Please note change in reference range.   Potassium 4.1 3.5 - 5.1 mmol/L    Comment: Please note change in reference range.   Chloride 102 96 - 112 mEq/L   CO2 27 19 - 32 mmol/L   Glucose, Bld 95 70 - 99 mg/dL   BUN 11 6 - 23 mg/dL   Creatinine, Ser 1.34 0.50 - 1.35 mg/dL   Calcium 9.3 8.4 - 10.5 mg/dL   GFR calc non Af Amer 64 (L) >90 mL/min   GFR calc Af Amer 74 (L) >90 mL/min    Comment: (NOTE) The eGFR has been calculated using the CKD EPI equation. This calculation has not been validated in all clinical situations. eGFR's persistently <90 mL/min signify possible Chronic Kidney Disease.    Anion gap 8 5 - 15  I-stat troponin, ED (not at [facility])     Status: None   Collection Time: 07/21/14  4:04 PM  Result Value Ref Range   Troponin i, poc 0.00 0.00 - 0.08 ng/mL   Comment 3            Comment: Due to the release kinetics of cTnI, a negative result within the first hours of the onset of symptoms does not rule out myocardial infarction  with certainty. If myocardial infarction is still suspected, repeat the test at appropriate intervals.   Troponin I (q 6hr x 3)     Status: None   Collection Time: 07/21/14  6:50 PM  Result Value Ref Range   Troponin I <0.03 <0.031 ng/mL    Comment:        NO INDICATION OF MYOCARDIAL INJURY. Please note change in reference range.   Heparin level (unfractionated)     Status: None   Collection Time: 07/22/14 12:32 AM  Result Value Ref Range   Heparin Unfractionated 0.36 0.30 - 0.70 IU/mL    Comment:        IF HEPARIN RESULTS ARE BELOW EXPECTED VALUES, AND PATIENT DOSAGE HAS BEEN CONFIRMED, SUGGEST FOLLOW UP TESTING OF ANTITHROMBIN III LEVELS.   CBC     Status: Abnormal   Collection Time: 07/22/14 12:32 AM  Result Value Ref Range   WBC 10.6 (H) 4.0 - 10.5 K/uL   RBC 5.20 4.22 - 5.81 MIL/uL   Hemoglobin 14.2 13.0 - 17.0 g/dL   HCT 43.5 39.0 - 52.0 %   MCV 83.7 78.0 - 100.0 fL   MCH 27.3 26.0 - 34.0 pg    MCHC 32.6 30.0 - 36.0 g/dL   RDW 12.8 11.5 - 15.5 %   Platelets 352 150 - 400 K/uL  Troponin I (q 6hr x 3)     Status: None   Collection Time: 07/22/14 12:32 AM  Result Value Ref Range   Troponin I <0.03 <0.031 ng/mL    Comment:        NO INDICATION OF MYOCARDIAL INJURY. Please note change in reference range.   Protime-INR     Status: None   Collection Time: 07/22/14 12:32 AM  Result Value Ref Range   Prothrombin Time 14.4 11.6 - 15.2 seconds   INR 1.11 0.00 - 6.30  Basic metabolic panel     Status: Abnormal   Collection Time: 07/22/14 12:32 AM  Result Value Ref Range   Sodium 135 135 - 145 mmol/L    Comment: Please note change in reference range.   Potassium 3.4 (L) 3.5 - 5.1 mmol/L    Comment: Please note change in reference range. DELTA CHECK NOTED    Chloride 99 96 - 112 mEq/L   CO2 33 (H) 19 - 32 mmol/L   Glucose, Bld 121 (H) 70 - 99 mg/dL   BUN 14 6 - 23 mg/dL   Creatinine, Ser 1.27 0.50 - 1.35 mg/dL   Calcium 8.8 8.4 - 10.5 mg/dL   GFR calc non Af Amer 68 (L) >90 mL/min   GFR calc Af Amer 79 (L) >90 mL/min    Comment: (NOTE) The eGFR has been calculated using the CKD EPI equation. This calculation has not been validated in all clinical situations. eGFR's persistently <90 mL/min signify possible Chronic Kidney Disease.    Anion gap 3 (L) 5 - 15  Lipid panel     Status: Abnormal   Collection Time: 07/22/14 12:32 AM  Result Value Ref Range   Cholesterol 231 (H) 0 - 200 mg/dL   Triglycerides 338 (H) <150 mg/dL   HDL 38 (L) >39 mg/dL   Total CHOL/HDL Ratio 6.1 RATIO   VLDL 68 (H) 0 - 40 mg/dL   LDL Cholesterol 125 (H) 0 - 99 mg/dL    Comment:        Total Cholesterol/HDL:CHD Risk Coronary Heart Disease Risk Table  Men   Women  1/2 Average Risk   3.4   3.3  Average Risk       5.0   4.4  2 X Average Risk   9.6   7.1  3 X Average Risk  23.4   11.0        Use the calculated Patient Ratio above and the CHD Risk Table to determine the  patient's CHD Risk.        ATP III CLASSIFICATION (LDL):  <100     mg/dL   Optimal  100-129  mg/dL   Near or Above                    Optimal  130-159  mg/dL   Borderline  160-189  mg/dL   High  >190     mg/dL   Very High   Troponin I (q 6hr x 3)     Status: None   Collection Time: 07/22/14  6:19 AM  Result Value Ref Range   Troponin I <0.03 <0.031 ng/mL    Comment:        NO INDICATION OF MYOCARDIAL INJURY. Please note change in reference range.   Heparin level (unfractionated)     Status: None   Collection Time: 07/22/14  6:19 AM  Result Value Ref Range   Heparin Unfractionated 0.42 0.30 - 0.70 IU/mL    Comment:        IF HEPARIN RESULTS ARE BELOW EXPECTED VALUES, AND PATIENT DOSAGE HAS BEEN CONFIRMED, SUGGEST FOLLOW UP TESTING OF ANTITHROMBIN III LEVELS.   CBC     Status: None   Collection Time: 07/23/14  4:45 AM  Result Value Ref Range   WBC 9.1 4.0 - 10.5 K/uL   RBC 5.15 4.22 - 5.81 MIL/uL   Hemoglobin 13.8 13.0 - 17.0 g/dL   HCT 41.9 39.0 - 52.0 %   MCV 81.4 78.0 - 100.0 fL   MCH 26.8 26.0 - 34.0 pg   MCHC 32.9 30.0 - 36.0 g/dL   RDW 12.8 11.5 - 15.5 %   Platelets 347 150 - 400 K/uL    Imaging: No results found.  Assessment:  1. Principal Problem: 2.   Chest pain with moderate risk for cardiac etiology 3. Active Problems: 4.   Dyslipidemia 5.   Chest Pain - concerning for Unstable angina 6.   Chest pain 7.   Plan:  1. Mild cardiomyopathy noted at cath, appears non-ischemic. ?viral - plan to check 2D echo today, but expect discharge later today. Add low dose lisinopril 2.5 mg daily. Replete potassium. Add crestor 5 mg QHS for dyslipidemia.  Will need follow-up after discharge in 1 week with midlevel and then with Dr. Johnsie Cancel. He will need a work excuse to stay out of work until seen back in the office - his job is Building surveyor. 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  Time Spent Directly with Patient:  15 minutes  Length of Stay:  LOS: 2 days   Pixie Casino, MD, [facility] Attending Cardiologist [facility]  HILTY,Kenneth C 07/23/2014, 9:21 AM

## 2014-07-23 NOTE — Discharge Instructions (Signed)
Radial Site Care Refer to this sheet in the next few weeks. These instructions provide you with information on caring for yourself after your procedure. Your caregiver may also give you more specific instructions. Your treatment has been planned according to current medical practices, but problems sometimes occur. Call your caregiver if you have any problems or questions after your procedure. HOME CARE INSTRUCTIONS  You may shower the day after the procedure.Remove the bandage (dressing) and gently wash the site with plain soap and water.Gently pat the site dry.  Do not apply powder or lotion to the site.  Do not submerge the affected site in water for 3 to 5 days.  Inspect the site at least twice daily.  Do not flex or bend the affected arm for 24 hours.  No lifting over 5 pounds (2.3 kg) for 5 days after your procedure.  Do not drive home if you are discharged the same day of the procedure. Have someone else drive you.  You may drive 24 hours after the procedure unless otherwise instructed by your caregiver.  Do not operate machinery or power tools for 24 hours.  A responsible adult should be with you for the first 24 hours after you arrive home. What to expect:  Any bruising will usually fade within 1 to 2 weeks.  Blood that collects in the tissue (hematoma) may be painful to the touch. It should usually decrease in size and tenderness within 1 to 2 weeks. SEEK IMMEDIATE MEDICAL CARE IF:  You have unusual pain at the radial site.  You have redness, warmth, swelling, or pain at the radial site.  You have drainage (other than a small amount of blood on the dressing).  You have chills.  You have a fever or persistent symptoms for more than 72 hours.  You have a fever and your symptoms suddenly get worse.  Your arm becomes pale, cool, tingly, or numb.  You have heavy bleeding from the site. Hold pressure on the site. Document Released: 07/23/2010 Document Revised:  09/12/2011 Document Reviewed: 07/23/2010 [facility] Patient Information 2015 [place], [place]. This information is not intended to replace advice given to you by your health care provider. Make sure you discuss any questions you have with your health care provider.  Chest Pain (Nonspecific) It is often hard to give a diagnosis for the cause of chest pain. There is always a chance that your pain could be related to something serious, such as a heart attack or a blood clot in the lungs. You need to follow up with your doctor. HOME CARE  If antibiotic medicine was given, take it as directed by your doctor. Finish the medicine even if you start to feel better.  For the next few days, avoid activities that bring on chest pain. Continue physical activities as told by your doctor.  Do not use any tobacco products. This includes cigarettes, chewing tobacco, and e-cigarettes.  Avoid drinking alcohol.  Only take medicine as told by your doctor.  Follow your doctor's suggestions for more testing if your chest pain does not go away.  Keep all doctor visits you made. GET HELP IF:  Your chest pain does not go away, even after treatment.  You have a rash with blisters on your chest.  You have a fever. GET HELP RIGHT AWAY IF:   You have more pain or pain that spreads to your arm, neck, jaw, back, or belly (abdomen).  You have shortness of breath.  You cough more than usual  or cough up blood.  You have very bad back or belly pain.  You feel sick to your stomach (nauseous) or throw up (vomit).  You have very bad weakness.  You pass out (faint).  You have chills. This is an emergency. Do not wait to see if the problems will go away. Call your local emergency services (911 in [place] Do not drive yourself to the hospital. MAKE SURE YOU:   Understand these instructions.  Will watch your condition.  Will get help right away if you are not doing well or get worse. Document Released:  12/07/2007 Document Revised: 06/25/2013 Document Reviewed: 12/07/2007 [facility] Patient Information 2015 [place], [place]. This information is not intended to replace advice given to you by your health care provider. Make sure you discuss any questions you have with your health care provider.  Cardiac Diet A cardiac diet can help stop heart disease or a stroke from happening. It involves eating less unhealthy fats and eating more healthy fats.  FOODS TO AVOID OR LIMIT  Limit saturated fats. This type of fat is found in oils and dairy products, such as:  Coconut oil.  Palm oil.  Cocoa butter.  Butter.  Avoid trans-fat or hydrogenated oils. These are found in fried or pre-made baked goods, such as:  Margarine.  Pre-made cookies, cakes, and crackers.  Limit processed meats (hot dogs, deli meats, sausage) to 3 ounces a week.  Limit high-fat meats (marbled meats, fried chicken, or chicken with skin) to 3 ounces a week.  Limit salt (sodium) to 1500 milligrams a day.   Limit sweets and drinks with added sugar to no more than 5 servings a week. One serving is:  1 tablespoon of sugar.  1 tablespoon of jelly or jam.   cup sorbet.  1 cup lemonade.   cup regular soda. EAT MORE OF THE FOLLOWING FOODS Fruit  Eat 4to 5 servings a day. One serving of fruit is:  1 medium whole fruit.   cup dried fruit.   cup of fresh, frozen, or canned fruit.   cup 100% fruit juice. Vegetables  Eat 4 to 5 servings a day. One serving is:  1 cup raw leafy vegetables.   cup raw or cooked, cut-up vegetables.   cup vegetable juice. Whole Grains  Eat 3 servings a day (1 ounce equals 1 serving). Legumes (such as beans, peas, and lentils)   Eat at least 4 servings a week ( cup equals 1 serving). Nuts and Seeds   Eat at least 4 servings a week ( cup equals 1 serving). Dietary Fiber  Eat 20 to 30 grams a day. Some foods high in dietary fiber include:  Dried beans.  Citrus  fruits.  Apples, bananas.  Broccoli, Brussels sprouts, and eggplant.  Oats. Omega-3 Fats  Eat food with omega-3 fats. You can also take a dietary pill (supplement) that has 1 gram of DHA and EPA. Have 3.5 ounces of fatty fish a week, such as:  Salmon.  Mackerel.  [place] tuna.  Sardines.  Lake trout.  Herring. PREPARING YOUR FOOD  Broil, bake, steam, or roast foods. Do not fry food. Do not cook food in butter (fat).  Use non-stick cooking sprays.  Remove skin from poultry, such as chicken and [place].  Remove fat from meat.  Take the fat off the top of stews, soups, and gravy.  Use lemon or herbs to flavor food instead of using butter or margarine.  Use nonfat yogurt, salsa, or low-fat dressings for salads.  Document Released: 12/20/2011 Document Reviewed: 12/20/2011 [facility] Patient Information 2015 [place], [place]. This information is not intended to replace advice given to you by your health care provider. Make sure you discuss any questions you have with your health care provider.

## 2014-07-23 NOTE — Progress Notes (Signed)
UR completed 

## 2014-08-07 ENCOUNTER — Encounter: Payer: Self-pay | Admitting: Physician Assistant

## 2014-08-07 ENCOUNTER — Ambulatory Visit (INDEPENDENT_AMBULATORY_CARE_PROVIDER_SITE_OTHER): Payer: Managed Care, Other (non HMO) | Admitting: Physician Assistant

## 2014-08-07 ENCOUNTER — Telehealth: Payer: Self-pay | Admitting: *Deleted

## 2014-08-07 VITALS — BP 96/62 | HR 66 | Ht 72.0 in | Wt 264.2 lb

## 2014-08-07 DIAGNOSIS — I429 Cardiomyopathy, unspecified: Secondary | ICD-10-CM

## 2014-08-07 DIAGNOSIS — R739 Hyperglycemia, unspecified: Secondary | ICD-10-CM

## 2014-08-07 DIAGNOSIS — E785 Hyperlipidemia, unspecified: Secondary | ICD-10-CM

## 2014-08-07 DIAGNOSIS — E876 Hypokalemia: Secondary | ICD-10-CM

## 2014-08-07 DIAGNOSIS — N182 Chronic kidney disease, stage 2 (mild): Secondary | ICD-10-CM

## 2014-08-07 DIAGNOSIS — I428 Other cardiomyopathies: Secondary | ICD-10-CM

## 2014-08-07 LAB — BASIC METABOLIC PANEL
BUN: 23 mg/dL (ref 6–23)
CHLORIDE: 101 meq/L (ref 96–112)
CO2: 29 mEq/L (ref 19–32)
Calcium: 9.5 mg/dL (ref 8.4–10.5)
Creatinine, Ser: 1.37 mg/dL (ref 0.40–1.50)
GFR: 73.25 mL/min (ref >60.00)
GLUCOSE: 87 mg/dL (ref 70–99)
Potassium: 4.2 mEq/L (ref 3.5–5.1)
Sodium: 135 mEq/L (ref 135–145)

## 2014-08-07 NOTE — Addendum Note (Signed)
Addended by: Laurann Montana on: 08/07/2014 12:55 PM   Modules accepted: Level of Service

## 2014-08-07 NOTE — Patient Instructions (Signed)
Your physician recommends that you continue on your current medications as directed. Please refer to the Current Medication list given to you today.  Lab Today: Bmet  Please make sure your primary care physician checks a Lipid panel and Hepatic function panel at your upcoming physical  Ok to return to full duty  Your physician recommends that you schedule a follow-up appointment in: 3 months with Dr.Nishan

## 2014-08-07 NOTE — Progress Notes (Signed)
Quick Note:  BMET is stable on med regimen. Continue plan as discussed. Dayna Dunn PA-C  ______

## 2014-08-07 NOTE — Telephone Encounter (Signed)
pt notified about lab results with verbal understanding  

## 2014-08-07 NOTE — Progress Notes (Signed)
Cardiology Office Note  Date:  08/07/2014   Patient ID:  Philip, Bruce 05/20/1971, MRN [id number]  PCP:  Pcp Not In System  Cardiologist:  Philip Bruce  Chief Complaint: here for follow-up post-hospital of recent chest pain  History of Present Illness: Philip Bruce is a 44 y.o. male with history of dyslipidemia and recently diagnosed NICM (?viral) who presents for post-hospital follow-up. Prior to recent admission, he had no prior cardiac hx - he reported being seen by a cardiologist more than 15 years ago for evaluation of palpitations with reportedly benign heart monitor.   He was admitted to [facility] 1/18-1/20/16 for evaluation of chest pain. He recently had URI symptoms consistent with the common cold. However, during that time he also reported development of intermittent left-sided chest pain radiating to his back, described as a dull/pressure-like sensation. The pain was slightly pleuritic, worse with deep breathing and coughing. It was not worse with position changes, exertion or palpation. Due to ongoing symptoms he presented to [facility] where EKG obtained showed inferior ST depressions. However, he was CP free at the time the EKG was obtained. He was referred to the ED where EKG changes had resolved. There were some questionable ST elevations, felt to be probable repolarization abnormalities. Initial troponin, labs, CXR were unremarkable. He was admitted and ruled out for MI. He underwent a left heart cath which revealed normal coronary arteries and filling pressures with an EF of 45%.  2D echo was also completed and revealed EF of 45-50%, mild diffuse hypokinesis, G1DD, normal RV, no significant valvular disease. 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 He was started back on a trial of low dose Crestor since lipids were deranged with trig 338, HDL 38, LDL 125, tchol 231. Lisinopril 2.5 mg was added.  His Cr also ranged 1.27-1.4, c/w CKD stage II and stable from 1 year  ago. BMET also revealed mild elevation of blood sugar to 121.  He is doing well since discharge. No CP, SOB, LEE, orthopnea, dizziness, PND, palpitations or syncope. He is eager to return to full duty. He used to play basketball but cut back the last several weeks post-discharge, awaiting clearance from our office to gradually return. No issues with medications.  Past Medical History  Diagnosis Date  . Dyslipidemia   . NICM (nonischemic cardiomyopathy)     a. 07/2014: EF 45%, normal coronaries. ?Viral cardiomyopathy.  . Hyperglycemia   . CKD (chronic kidney disease), stage II     Past Surgical History  Procedure Laterality Date  . Left heart catheterization with coronary angiogram N/A 07/22/2014    Procedure: LEFT HEART CATHETERIZATION WITH CORONARY ANGIOGRAM;  Surgeon: Philip Lex, MD;  Location: [facility] CATH LAB;  Service: Cardiovascular;  Laterality: N/A;    Current Outpatient Prescriptions  Medication Sig Dispense Refill  . lisinopril (PRINIVIL,ZESTRIL) 2.5 MG tablet Take 1 tablet (2.5 mg total) by mouth daily. 30 tablet 5  . Protein POWD Take 1 application by mouth daily as needed (FOR WORKOUT).    . rosuvastatin (CRESTOR) 5 MG tablet Take 1 tablet (5 mg total) by mouth daily at 6 PM. 30 tablet 5   No current facility-administered medications for this visit.    Allergies:   Meloxicam; Statins; and Etodolac   Social History:  The patient  reports that he has been passively smoking.  He does not have any smokeless tobacco history on file.   Family History:  The patient's family history includes Coronary artery disease (  age of onset: 87) in his mother.  ROS:  Please see the history of present illness. All other systems are reviewed and otherwise negative.   PHYSICAL EXAM:  VS:  BP 96/62 mmHg  Pulse 66  Ht 6' (1.829 m)  Wt 264 lb 3.2 oz (119.84 kg)  BMI 35.82 kg/m2  SpO2 99% BMI: Body mass index is 35.82 kg/(m^2). Well nourished, well developed M, in no acute  distress HEENT: normocephalic, atraumatic Neck: no JVD Cardiac:  normal S1, S2; RRR; no murmur Lungs:  clear to auscultation bilaterally, no wheezing, rhonchi or rales Abd: soft, nontender, no hepatomegaly Ext: no edema, right radial site without ecchymosis/hematoma, good pulse Skin: warm and dry Neuro:  moves all extremities spontaneously, no focal abnormalities noted  EKG:   NSR 66bpm, sinus arrhythmia, nonspecific ST-T changes, no acute change from prior  Recent Labs: 07/22/2014: BUN 14; Creatinine 1.27; Potassium 3.4*; Sodium 135 07/23/2014: Hemoglobin 13.8; Platelets 347  07/22/2014: Cholesterol, Total 231*; HDL Cholesterol by NMR 38*; LDL (calc) 125*; Total CHOL/HDL Ratio 6.1; Triglycerides 338*; VLDL 68*   Estimated Creatinine Clearance: 101.3 mL/min (by C-G formula based on Cr of 1.27).   Wt Readings from Last 3 Encounters:  08/07/14 264 lb 3.2 oz (119.84 kg)  07/23/14 275 lb (124.739 kg)  02/18/13 283 lb (128.368 kg)     Other studies reviewed: Additional studies/records reviewed today include: cath as above.  ASSESSMENT AND PLAN:  1. NICM - question viral cardiomyopathy. Continue ACEI. BP a little soft but he is tolerating this well. Salt/fluids restriction discussed along with daily weights and surveillance for symptoms of CHF. He appears euvolemic. Cleared to return to full duty as tolerated. Will defer timing of repeat echocardiogram to Philip Bruce when he sees the patient in follow-up. 2. Dyslipidemia - continue Crestor. The patient has a followup physical in March at which time he says his PCP is checking follow-up labs. I requested he obtain a lipid panel and LFTs at that time. 3. Possible CKD stage II based on GFR - recheck BMET today. To be followed further by primary care. 4. Hyperglycemia - the patient has follow-up with his PCP in March for his yearly physical. 5. Hypokalemia - recheck BMET today. 6. Obesity Body mass index is 35.82 kg/(m^2). - encouraged gradual  return to physical activity. He says he used to weigh 310lbs but has gradually lost weight with healthy lifestyle changes.  Disposition: F/u with Philip Bruce in 3 months.  Current medicines are reviewed at length with the patient today.  The patient did not have any concerns regarding medicines.  Thomasene Mohair PA-C 08/07/2014 11:40 AM     [facility] [address] [place] [address] [phone] (office)  [phone] (fax)

## 2014-09-15 ENCOUNTER — Telehealth: Payer: Self-pay | Admitting: Cardiovascular Disease

## 2014-09-15 NOTE — Telephone Encounter (Signed)
New Msg         Pt calling states he was informed to have some tests completed with his PCP at last office visit.   Please return pt call and advise of the test.

## 2014-09-17 NOTE — Telephone Encounter (Signed)
PT  HAVING   BMET  LIPID LIVER AND   MAYBE TSH  AT  PMD  LISTED LABS ARE  ENOUGH PT   NOTIFIED .Zack Seal

## 2014-09-17 NOTE — Telephone Encounter (Signed)
Follow up      Pt is on his way to have labs drawn for his PCP (check up is Friday).  He has an appt with Dr Eden Emms 10-08-14. Will Dr Eden Emms want to add labs to be drawn so that he will be stuck only once?

## 2014-09-29 ENCOUNTER — Telehealth: Payer: Self-pay | Admitting: Interventional Cardiology

## 2014-09-29 ENCOUNTER — Telehealth: Payer: Self-pay | Admitting: Cardiovascular Disease

## 2014-09-29 NOTE — Telephone Encounter (Signed)
New Message  Pt had question for Rn, was not specific. Please call back and discuss.

## 2014-09-29 NOTE — Telephone Encounter (Signed)
Spoke with pt. He has started working out and is asking if OK to take whey protein and also carnivore protein product from [facility].  Will review with pharmacist

## 2014-09-29 NOTE — Telephone Encounter (Signed)
Reviewed with Audrie Lia, PharmD and should be OK from a cardiac standpoint but pt should check with MD who monitors his renal function to make sure it is OK from a renal standpoint before starting.  I spoke with pt and gave him this information.

## 2014-10-07 NOTE — Progress Notes (Signed)
Patient ID: Philip Bruce, male   DOB: Jul 29, 1970, 44 y.o.   MRN: [id number]    Cardiology Office Note  Date:  10/07/2014   Patient ID:  Philip, Bruce [birth year], MRN [id number]  PCP:  Pcp Not In System  Cardiologist:  Nishan  Chief Complaint: here for follow-up post-hospital of recent chest pain  History of Present Illness: [name] is a 45 y.o. male with history of dyslipidemia and recently diagnosed NICM (?viral) who presents for post-hospital follow-up. Prior to recent admission, he had no prior cardiac hx - he reported being seen by a cardiologist more than 15 years ago for evaluation of palpitations with reportedly benign heart monitor.   He was admitted to [facility] 1/18-1/20/16 for evaluation of chest pain. He recently had URI symptoms consistent with the common cold. However, during that time he also reported development of intermittent left-sided chest pain radiating to his back, described as a dull/pressure-like sensation. The pain was slightly pleuritic, worse with deep breathing and coughing. It was not worse with position changes, exertion or palpation. Due to ongoing symptoms he presented to [facility] where EKG obtained showed inferior ST depressions. However, he was CP free at the time the EKG was obtained. He was referred to the ED where EKG changes had resolved. There were some questionable ST elevations, felt to be probable repolarization abnormalities. Initial troponin, labs, CXR were unremarkable. He was admitted and ruled out for MI. He underwent a left heart cath which revealed normal coronary arteries and filling pressures with an EF of 45%.  2D echo was also completed and revealed EF of 45-50%, mild diffuse hypokinesis, G1DD, normal RV, no significant valvular disease. 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 He was started back on a trial of low dose Crestor since lipids were deranged with trig 338, HDL 38, LDL 125, tchol 231. Lisinopril 2.5 mg was  added.  His Cr also ranged 1.27-1.4, c/w CKD stage II and stable from 1 year ago. BMET also revealed mild elevation of blood sugar to 121.  He is doing well since discharge. No CP, SOB, LEE, orthopnea, dizziness, PND, palpitations or syncope. He is eager to return to full duty. He used to play basketball but cut back the last several weeks post-discharge, awaiting clearance from our office to gradually return. No issues with medications.  Past Medical History  Diagnosis Date  . Dyslipidemia   . NICM (nonischemic cardiomyopathy)     a. 07/2014: EF 45%, normal coronaries. ?Viral cardiomyopathy.  . Hyperglycemia   . CKD (chronic kidney disease), stage II     Past Surgical History  Procedure Laterality Date  . Left heart catheterization with coronary angiogram N/A 07/22/2014    Procedure: LEFT HEART CATHETERIZATION WITH CORONARY ANGIOGRAM;  Surgeon: Marykay Lex, MD;  Location: [facility] CATH LAB;  Service: Cardiovascular;  Laterality: N/A;    Current Outpatient Prescriptions  Medication Sig Dispense Refill  . lisinopril (PRINIVIL,ZESTRIL) 2.5 MG tablet Take 1 tablet (2.5 mg total) by mouth daily. 30 tablet 5  . Protein POWD Take 1 application by mouth daily as needed (FOR WORKOUT).    . rosuvastatin (CRESTOR) 5 MG tablet Take 1 tablet (5 mg total) by mouth daily at 6 PM. 30 tablet 5   No current facility-administered medications for this visit.    Allergies:   Meloxicam; Statins; and Etodolac   Social History:  The patient  reports that he has been passively smoking.  He does not have any smokeless  tobacco history on file.   Family History:  The patient's family history includes Coronary artery disease (age of onset: 84) in his mother.  ROS:  Please see the history of present illness. All other systems are reviewed and otherwise negative.   PHYSICAL EXAM:  VS:  There were no vitals taken for this visit. BMI: There is no weight on file to calculate BMI. Well nourished, well developed M,  in no acute distress HEENT: normocephalic, atraumatic Neck: no JVD Cardiac:  normal S1, S2; RRR; no murmur Lungs:  clear to auscultation bilaterally, no wheezing, rhonchi or rales Abd: soft, nontender, no hepatomegaly Ext: no edema, right radial site without ecchymosis/hematoma, good pulse Skin: warm and dry Neuro:  moves all extremities spontaneously, no focal abnormalities noted  EKG:   NSR 66bpm, sinus arrhythmia, nonspecific ST-T changes, no acute change from prior  Recent Labs: 07/23/2014: Hemoglobin 13.8; Platelets 347 08/07/2014: BUN 23; Creatinine 1.37; Potassium 4.2; Sodium 135  07/22/2014: Cholesterol, Total 231*; HDL-C 38*; LDL (calc) 125*; Total CHOL/HDL Ratio 6.1; Triglycerides 338*; VLDL 68*   CrCl cannot be calculated (Unknown ideal weight.).   Wt Readings from Last 3 Encounters:  08/07/14 264 lb 3.2 oz (119.84 kg)  07/23/14 275 lb (124.739 kg)  02/18/13 283 lb (128.368 kg)     Other studies reviewed: Additional studies/records reviewed today include: cath as above.  ASSESSMENT AND PLAN:  1. NICM - question viral cardiomyopathy. Continue ACEI. BP a little soft but he is tolerating this well. Salt/fluids restriction discussed along with daily weights and surveillance for symptoms of CHF. He appears euvolemic. Cleared to return to full duty as tolerated.  2. Dyslipidemia - continue Crestor. The patient has a followup physical in March at which time he says his PCP is checking follow-up labs. I requested he obtain a lipid panel and LFTs at that time. 3. Possible CKD stage II  Cr 1.37 2/16  To be followed further by primary care. 4. Hyperglycemia - the patient has follow-up with his PCP in March for his yearly physical. 5. Hypokalemia - recheck BMET today. 6. Obesity There is no weight on file to calculate BMI. - encouraged gradual return to physical activity. He says he used to weigh 310lbs but has gradually lost weight with healthy lifestyle changes.  Disposition: F/u  with me 6 months   Current medicines are reviewed at length with the patient today.  The patient did not have any concerns regarding medicines.  Charlton Haws

## 2014-10-08 ENCOUNTER — Encounter: Payer: Managed Care, Other (non HMO) | Admitting: Cardiovascular Disease

## 2014-10-20 NOTE — Progress Notes (Signed)
Patient ID: Zaniel Marineau, male   DOB: 07-Aug-1970, 44 y.o.   MRN: [id number]    Cardiology Office Note  Date:  10/20/2014   Patient ID:  Maggie, Dworkin 04/07/1971, MRN [id number]  PCP:  Pcp Not In System  Cardiologist:  Noell Lorensen  Chief Complaint: here for follow-up post-hospital of recent chest pain  History of Present Illness: Gianfranco Araki is a 44 y.o. male with history of dyslipidemia and recently diagnosed NICM (?viral) who presents for post-hospital follow-up. Prior to recent admission, he had no prior cardiac hx - he reported being seen by a cardiologist more than 15 years ago for evaluation of palpitations with reportedly benign heart monitor.   He was admitted to [facility] 1/18-1/20/16 for evaluation of chest pain. He recently had URI symptoms consistent with the common cold. However, during that time he also reported development of intermittent left-sided chest pain radiating to his back, described as a dull/pressure-like sensation. The pain was slightly pleuritic, worse with deep breathing and coughing. It was not worse with position changes, exertion or palpation. Due to ongoing symptoms he presented to [facility] where EKG obtained showed inferior ST depressions. However, he was CP free at the time the EKG was obtained. He was referred to the ED where EKG changes had resolved. There were some questionable ST elevations, felt to be probable repolarization abnormalities. Initial troponin, labs, CXR were unremarkable. He was admitted and ruled out for MI. He underwent a left heart cath which revealed normal coronary arteries and filling pressures with an EF of 45%.  2D echo was also completed and revealed EF of 45-50%, mild diffuse hypokinesis, G1DD, normal RV, no significant valvular disease. 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 He was started back on a trial of low dose Crestor since lipids were deranged with trig 338, HDL 38, LDL 125, tchol 231. Lisinopril 2.5 mg was  added.  His Cr also ranged 1.27-1.4, c/w CKD stage II and stable from 1 year ago. BMET also revealed mild elevation of blood sugar to 121.  He is doing well since discharge. No CP, SOB, LEE, orthopnea, dizziness, PND, palpitations or syncope. He is eager to return to full duty. He used to play basketball but cut back the last several weeks post-discharge, awaiting clearance from our office to gradually return. No issues with medications.  Past Medical History  Diagnosis Date  . Dyslipidemia   . NICM (nonischemic cardiomyopathy)     a. 07/2014: EF 45%, normal coronaries. ?Viral cardiomyopathy.  . Hyperglycemia   . CKD (chronic kidney disease), stage II     Past Surgical History  Procedure Laterality Date  . Left heart catheterization with coronary angiogram N/A 07/22/2014    Procedure: LEFT HEART CATHETERIZATION WITH CORONARY ANGIOGRAM;  Surgeon: Marykay Lex, MD;  Location: [facility] CATH LAB;  Service: Cardiovascular;  Laterality: N/A;    Current Outpatient Prescriptions  Medication Sig Dispense Refill  . lisinopril (PRINIVIL,ZESTRIL) 2.5 MG tablet Take 1 tablet (2.5 mg total) by mouth daily. 30 tablet 5  . Protein POWD Take 1 application by mouth daily as needed (FOR WORKOUT).    . rosuvastatin (CRESTOR) 5 MG tablet Take 1 tablet (5 mg total) by mouth daily at 6 PM. 30 tablet 5   No current facility-administered medications for this visit.    Allergies:   Meloxicam; Statins; and Etodolac   Social History:  The patient  reports that he has been passively smoking.  He does not have any smokeless  tobacco history on file.   Family History:  The patient's family history includes Coronary artery disease (age of onset: 2) in his mother.  ROS:  Please see the history of present illness. All other systems are reviewed and otherwise negative.   PHYSICAL EXAM:  VS:  There were no vitals taken for this visit. BMI: There is no weight on file to calculate BMI. Well nourished, well developed M,  in no acute distress HEENT: normocephalic, atraumatic Neck: no JVD Cardiac:  normal S1, S2; RRR; no murmur Lungs:  clear to auscultation bilaterally, no wheezing, rhonchi or rales Abd: soft, nontender, no hepatomegaly Ext: no edema, right radial site without ecchymosis/hematoma, good pulse Skin: warm and dry Neuro:  moves all extremities spontaneously, no focal abnormalities noted  EKG:   08/07/14   NSR 66bpm, sinus arrhythmia, nonspecific ST-T changes, no acute change from prior  Recent Labs: 07/23/2014: Hemoglobin 13.8; Platelets 347 08/07/2014: BUN 23; Creatinine 1.37; Potassium 4.2; Sodium 135  07/22/2014: Cholesterol, Total 231*; HDL-C 38*; LDL (calc) 125*; Total CHOL/HDL Ratio 6.1; Triglycerides 338*; VLDL 68*   CrCl cannot be calculated (Unknown ideal weight.).   Wt Readings from Last 3 Encounters:  08/07/14 264 lb 3.2 oz (119.84 kg)  07/23/14 275 lb (124.739 kg)  02/18/13 283 lb (128.368 kg)     Other studies reviewed: Additional studies/records reviewed today include: cath as above.  ASSESSMENT AND PLAN:  1. NICM - question viral cardiomyopathy. Continue ACEI. BP a little soft but he is tolerating this well. Salt/fluids restriction discussed along with daily weights and surveillance for symptoms of CHF. He appears euvolemic. Cleared to return to full duty as tolerated.  2. Dyslipidemia - continue Crestor. The patient has a followup physical in March at which time he says his PCP is checking follow-up labs. I requested he obtain a lipid panel and LFTs at that time. 3. Possible CKD stage II based on GFR - recheck BMET today. To be followed further by primary care. 4. Hyperglycemia - the patient has follow-up with his PCP in March for his yearly physical. 5. Hypokalemia - recheck BMET today. 6. Obesity There is no weight on file to calculate BMI. - encouraged gradual return to physical activity. He says he used to weigh 310lbs but has gradually lost weight with healthy lifestyle  changes.  Disposition: F/u with me in 6 months   Current medicines are reviewed at length with the patient today.  The patient did not have any concerns regarding medicines.  Charlton Haws

## 2014-10-22 ENCOUNTER — Encounter: Payer: Managed Care, Other (non HMO) | Admitting: Cardiovascular Disease

## 2014-11-24 NOTE — Progress Notes (Signed)
Patient ID: Philip Bruce, male   DOB: 1971-05-09, 44 y.o.   MRN: [id number]    Cardiology Office Note  Date:  11/24/2014   Patient ID:  Philip Bruce, Philip Bruce 1971/01/15, MRN [id number]  PCP:  Pcp Not In System  Cardiologist:  Philip Bruce  Chief Complaint: here for follow-up post-hospital of recent chest pain  History of Present Illness: Philip Bruce is a 44 y.o. male with history of dyslipidemia and 07/2014  diagnosed NICM (?viral) who presents for post-hospital follow-up. Prior to recent admission, he had no prior cardiac hx - he reported being seen by a cardiologist more than 15 years ago for evaluation of palpitations with reportedly benign heart monitor.   He was admitted to [facility] 1/18-1/20/16 for evaluation of chest pain. He recently had URI symptoms consistent with the common cold. However, during that time he also reported development of intermittent left-sided chest pain radiating to his back, described as a dull/pressure-like sensation. The pain was slightly pleuritic, worse with deep breathing and coughing. It was not worse with position changes, exertion or palpation. Due to ongoing symptoms he presented to [facility] where EKG obtained showed inferior ST depressions. However, he was CP free at the time the EKG was obtained. He was referred to the ED where EKG changes had resolved. There were some questionable ST elevations, felt to be probable repolarization abnormalities. Initial troponin, labs, CXR were unremarkable. He was admitted and ruled out for MI. He underwent a left heart cath which revealed normal coronary arteries and filling pressures with an EF of 45%.  2D echo was also completed and revealed EF of 45-50%, mild diffuse hypokinesis, G1DD, normal RV, no significant valvular disease. 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 He was started back on a trial of low dose Crestor since lipids were deranged with trig 338, HDL 38, LDL 125, tchol 231. Lisinopril 2.5 mg was  added.  His Cr also ranged 1.27-1.4, c/w CKD stage II and stable from 1 year ago. BMET also revealed mild elevation of blood sugar to 121.  Labs followed at [facility]  LDL better no rhabdo Cough is seasonal and not related to ACE Using Whey protein supplements   Occasional palpitations   Past Medical History  Diagnosis Date  . Dyslipidemia   . NICM (nonischemic cardiomyopathy)     a. 07/2014: EF 45%, normal coronaries. ?Viral cardiomyopathy.  . Hyperglycemia   . CKD (chronic kidney disease), stage II     Past Surgical History  Procedure Laterality Date  . Left heart catheterization with coronary angiogram N/A 07/22/2014    Procedure: LEFT HEART CATHETERIZATION WITH CORONARY ANGIOGRAM;  Surgeon: Philip Lex, MD;  Location: [facility] CATH LAB;  Service: Cardiovascular;  Laterality: N/A;    Current Outpatient Prescriptions  Medication Sig Dispense Refill  . lisinopril (PRINIVIL,ZESTRIL) 2.5 MG tablet Take 1 tablet (2.5 mg total) by mouth daily. 30 tablet 5  . Protein POWD Take 1 application by mouth daily as needed (FOR WORKOUT).    . rosuvastatin (CRESTOR) 5 MG tablet Take 1 tablet (5 mg total) by mouth daily at 6 PM. 30 tablet 5   No current facility-administered medications for this visit.    Allergies:   Meloxicam; Statins; and Etodolac   Social History:  The patient  reports that he has been passively smoking.  He does not have any smokeless tobacco history on file.   Family History:  The patient's family history includes Coronary artery disease (age of onset: 9) in  his mother.  ROS:  Please see the history of present illness. All other systems are reviewed and otherwise negative.   PHYSICAL EXAM:  VS:  There were no vitals taken for this visit. BMI: There is no weight on file to calculate BMI. Well nourished, well developed M, in no acute distress HEENT: normocephalic, atraumatic Neck: no JVD Cardiac:  normal S1, S2; RRR; no murmur Lungs:  clear to auscultation  bilaterally, no wheezing, rhonchi or rales Abd: soft, nontender, no hepatomegaly Ext: no edema, right radial site without ecchymosis/hematoma, good pulse Skin: warm and dry Neuro:  moves all extremities spontaneously, no focal abnormalities noted  EKG:   NSR 66bpm, sinus arrhythmia, nonspecific ST-T changes, no acute change from prior  Recent Labs: 07/23/2014: Hemoglobin 13.8; Platelets 347 08/07/2014: BUN 23; Creatinine 1.37; Potassium 4.2; Sodium 135  07/22/2014: Cholesterol, Total 231*; HDL-C 38*; LDL (calc) 125*; Total CHOL/HDL Ratio 6.1; Triglycerides 338*; VLDL 68*   CrCl cannot be calculated (Unknown ideal weight.).   Wt Readings from Last 3 Encounters:  08/07/14 119.84 kg (264 lb 3.2 oz)  07/23/14 124.739 kg (275 lb)  02/18/13 128.368 kg (283 lb)     Other studies reviewed: Additional studies/records reviewed today include: cath as above.  ASSESSMENT AND PLAN:  1. NICM - question viral cardiomyopathy. Continue ACEI. BP a little soft but he is toleratinga this well. Salt/fluids restriction discussed along with daily weights and surveillance for symptoms of CHF. He appears euvolemic. F/U echo in 6 months  2. Dyslipidemia - continue Crestor.  Labs reviewed LDL 125 February  3. Possible CKD stage II based on GFR - recheck BMET today. To be followed further by primary care. 4. Hyperglycemia - the patient has follow-up with his PCP in March for his yearly physical. 5. Hypokalemia - resolved on [facility] labs  6. Obesity  Has lost 20 lbs since last visit improved 7. Palpitations:  Benign sounding not sustained or frequent observe Event monitor if worsen  Current medicines are reviewed at length with the patient today.  The patient did not have any concerns regarding medicines. F/U with me in 6 months with Echo   Philip Bruce

## 2014-11-26 ENCOUNTER — Ambulatory Visit (INDEPENDENT_AMBULATORY_CARE_PROVIDER_SITE_OTHER): Payer: Managed Care, Other (non HMO) | Admitting: Cardiovascular Disease

## 2014-11-26 ENCOUNTER — Encounter: Payer: Self-pay | Admitting: Cardiovascular Disease

## 2014-11-26 VITALS — BP 100/72 | HR 57 | Ht 72.0 in | Wt 262.6 lb

## 2014-11-26 DIAGNOSIS — I429 Cardiomyopathy, unspecified: Secondary | ICD-10-CM

## 2014-11-26 DIAGNOSIS — I428 Other cardiomyopathies: Secondary | ICD-10-CM

## 2014-11-26 NOTE — Patient Instructions (Signed)
Medication Instructions:  NO CHANGES  Labwork: NONE  Testing/Procedures: Your physician has requested that you have an echocardiogram. Echocardiography is a painless test that uses sound waves to create images of your heart. It provides your doctor with information about the size and shape of your heart and how well your heart's chambers and valves are working. This procedure takes approximately one hour. There are no restrictions for this procedure. 6 MONTHS   SEE  DR Tillie Fantasia  SAME  DAY  Follow-Up: Your physician wants you to follow-up in: 6 MONTHS  WITH  DR Eden Emms  ECHO SAME DAY  You will receive a reminder letter in the mail two months in advance. If you don't receive a letter, please call our office to schedule the follow-up appointment.   Any Other Special Instructions Will Be Listed Below (If Applicable).

## 2015-01-08 ENCOUNTER — Telehealth: Payer: Self-pay | Admitting: Cardiovascular Disease

## 2015-01-08 NOTE — Telephone Encounter (Signed)
Spoke with pt and he wanting to start taking Hydroxycut for weight loss and Nugenix [employer] Testosterone supplement. Informed pt that I would route this information to Dr. Eden Emms for review and advisement,

## 2015-01-08 NOTE — Telephone Encounter (Signed)
New message      Can pt take hydroxycut and newgenics (male supplement something) with his heart medications?  OK to call friday

## 2015-01-09 NOTE — Telephone Encounter (Signed)
PT  NOTIFIED ./CY 

## 2015-01-09 NOTE — Telephone Encounter (Signed)
Ok to take both 

## 2015-02-04 ENCOUNTER — Other Ambulatory Visit: Payer: Self-pay | Admitting: Physician Assistant

## 2015-02-04 MED ORDER — ROSUVASTATIN CALCIUM 5 MG PO TABS: 5.0000 mg | ORAL_TABLET | Freq: Every day | ORAL | Status: DC

## 2015-02-04 MED ORDER — LISINOPRIL 2.5 MG PO TABS: 2.5000 mg | ORAL_TABLET | Freq: Every day | ORAL | Status: DC

## 2015-03-20 ENCOUNTER — Telehealth: Payer: Self-pay | Admitting: Cardiovascular Disease

## 2015-03-20 NOTE — Telephone Encounter (Signed)
PT  AWARE IS  OKAY TO   TAKE BOTH  PER LAST  PHONE  NOTE .Philip Bruce

## 2015-03-20 NOTE — Telephone Encounter (Signed)
New Message  Pt called requests a call back to discuss being able to take hydroxicut and new genics. He states that he has forgotten the answer that he was given back in July. Please to re-discuss.

## 2015-06-05 NOTE — Progress Notes (Signed)
Patient ID: [name], male   DOB: [birth year], 44 y.o.   MRN: [id number]    Cardiology Office Note  Date:  06/10/2015   Patient ID:  Philip Bruce, Philip Bruce [birth year], MRN [id number]  PCP:  Pcp Not In System  Cardiologist:  Matilde Pottenger  Chief Complaint: here for follow-up post-hospital of recent chest pain  History of Present Illness: Philip Bruce is a 44 y.o. male with history of dyslipidemia and 07/2014  diagnosed NICM (?viral) who presents for post-hospital follow-up. Prior to recent admission, he had no prior cardiac hx - he reported being seen by a cardiologist more than 15 years ago for evaluation of palpitations with reportedly benign heart monitor.   He was admitted to [facility] 1/18-1/20/16 for evaluation of chest pain. He recently had URI symptoms consistent with the common cold. However, during that time he also reported development of intermittent left-sided chest pain radiating to his back, described as a dull/pressure-like sensation. The pain was slightly pleuritic, worse with deep breathing and coughing. It was not worse with position changes, exertion or palpation. Due to ongoing symptoms he presented to [facility] where EKG obtained showed inferior ST depressions. However, he was CP free at the time the EKG was obtained. He was referred to the ED where EKG changes had resolved. There were some questionable ST elevations, felt to be probable repolarization abnormalities. Initial troponin, labs, CXR were unremarkable. He was admitted and ruled out for MI. He underwent a left heart cath which revealed normal coronary arteries and filling pressures with an EF of 45%.  2D echo was also completed and revealed EF of 45-50%, mild diffuse hypokinesis, G1DD, normal RV, no significant valvular disease. 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 He was started back on a trial of low dose Crestor since lipids were deranged with trig 338, HDL 38, LDL 125, tchol 231. Lisinopril 2.5 mg was  added.  His Cr also ranged 1.27-1.4, c/w CKD stage II and stable from 1 year ago. BMET also revealed mild elevation of blood sugar to 121.  Labs followed at [facility]  LDL better no rhabdo Cough is seasonal and not related to ACE Using Whey protein supplements   Occasional palpitations   Past Medical History  Diagnosis Date  . Dyslipidemia   . NICM (nonischemic cardiomyopathy) (HCC)     a. 07/2014: EF 45%, normal coronaries. ?Viral cardiomyopathy.  . Hyperglycemia   . CKD (chronic kidney disease), stage II     Past Surgical History  Procedure Laterality Date  . Left heart catheterization with coronary angiogram N/A 07/22/2014    Procedure: LEFT HEART CATHETERIZATION WITH CORONARY ANGIOGRAM;  Surgeon: Marykay Lex, MD;  Location: [facility] CATH LAB;  Service: Cardiovascular;  Laterality: N/A;    Current Outpatient Prescriptions  Medication Sig Dispense Refill  . diclofenac sodium (VOLTAREN) 1 % GEL Apply 1 application of 4 grams to rt knee twice to three times a day as needed for pain  1  . lisinopril (PRINIVIL,ZESTRIL) 2.5 MG tablet Take 1 tablet (2.5 mg total) by mouth daily. 30 tablet 5  . Multiple Vitamin (MULTI-VITAMIN PO) Take 1 tablet by mouth daily.    . rosuvastatin (CRESTOR) 5 MG tablet Take 1 tablet (5 mg total) by mouth daily at 6 PM. 30 tablet 5   No current facility-administered medications for this visit.    Allergies:   Meloxicam; Statins; and Etodolac   Social History:  The patient  reports that he has been passively smoking.  He does not have any smokeless tobacco history on file.   Family History:  The patient's family history includes Cancer in his father; Coronary artery disease (age of onset: 77) in his mother; Diabetes in his mother.  ROS:  Please see the history of present illness. All other systems are reviewed and otherwise negative.   PHYSICAL EXAM:  VS:  BP 100/70 mmHg  Pulse 57  Ht 6' (1.829 m)  Wt 127.37 kg (280 lb 12.8 oz)  BMI 38.07 kg/m2 BMI:  Body mass index is 38.07 kg/(m^2). Well nourished, well developed M, in no acute distress HEENT: normocephalic, atraumatic Neck: no JVD Cardiac:  normal S1, S2; RRR; no murmur Lungs:  clear to auscultation bilaterally, no wheezing, rhonchi or rales Abd: soft, nontender, no hepatomegaly Ext: no edema, right radial site without ecchymosis/hematoma, good pulse Skin: warm and dry Neuro:  moves all extremities spontaneously, no focal abnormalities noted  EKG:   08/07/14  NSR 66bpm, sinus arrhythmia, nonspecific ST-T changes, no acute change from prior  Recent Labs: 07/23/2014: Hemoglobin 13.8; Platelets 347 08/07/2014: BUN 23; Creatinine, Ser 1.37; Potassium 4.2; Sodium 135  07/22/2014: Cholesterol 231*; HDL 38*; LDL Cholesterol 125*; Total CHOL/HDL Ratio 6.1; Triglycerides 338*; VLDL 68*   CrCl cannot be calculated (Patient has no serum creatinine result on file.).   Wt Readings from Last 3 Encounters:  06/10/15 127.37 kg (280 lb 12.8 oz)  11/26/14 119.115 kg (262 lb 9.6 oz)  08/07/14 119.84 kg (264 lb 3.2 oz)     Other studies reviewed: Additional studies/records reviewed today include: cath as above.  ASSESSMENT AND PLAN:  1. NICM - question viral cardiomyopathy. Continue ACEI. BP a little soft but he is toleratinga this well. Salt/fluids restriction discussed along with daily weights and surveillance for symptoms of CHF. He appears euvolemic. F/U echo to assess EF 2. Dyslipidemia - continue Crestor.  Labs reviewed LDL 125 February  3. Hyperglycemia - the patient has follow-up with his PCP in March for his yearly physical. 4. Hypokalemia - resolved on [facility] labs  5. Obesity  Has lost 20 lbs since last visit improved 6. Palpitations:  Benign sounding not sustained or frequent observe Event monitor if worsen 7. CRF  Cr baseline around 1.3  F/u primary   Current medicines are reviewed at length with the patient today.  The patient did not have any concerns regarding medicines. F/U  with me in 6 months  Echo ordered  [employer]

## 2015-06-09 ENCOUNTER — Encounter: Payer: Self-pay | Admitting: Cardiovascular Disease

## 2015-06-10 ENCOUNTER — Encounter: Payer: Self-pay | Admitting: Cardiovascular Disease

## 2015-06-10 ENCOUNTER — Ambulatory Visit (INDEPENDENT_AMBULATORY_CARE_PROVIDER_SITE_OTHER): Payer: Managed Care, Other (non HMO) | Admitting: Cardiovascular Disease

## 2015-06-10 VITALS — BP 100/70 | HR 57 | Ht 72.0 in | Wt 280.8 lb

## 2015-06-10 DIAGNOSIS — I429 Cardiomyopathy, unspecified: Secondary | ICD-10-CM

## 2015-06-10 DIAGNOSIS — I428 Other cardiomyopathies: Secondary | ICD-10-CM

## 2015-06-10 NOTE — Patient Instructions (Signed)
Medication Instructions:  Your physician recommends that you continue on your current medications as directed. Please refer to the Current Medication list given to you today.  Labwork: NONE  Testing/Procedures: Your physician has requested that you have an echocardiogram. Echocardiography is a painless test that uses sound waves to create images of your heart. It provides your doctor with information about the size and shape of your heart and how well your heart's chambers and valves are working. This procedure takes approximately one hour. There are no restrictions for this procedure.  Follow-Up: Your physician wants you to follow-up in: 6 months with Dr. Nishan. You will receive a reminder letter in the mail two months in advance. If you don't receive a letter, please call our office to schedule the follow-up appointment.  Any Other Special Instructions Will Be Listed Below (If Applicable).     If you need a refill on your cardiac medications before your next appointment, please call your pharmacy.   

## 2015-06-24 ENCOUNTER — Other Ambulatory Visit (HOSPITAL_COMMUNITY): Payer: Managed Care, Other (non HMO)

## 2015-07-22 ENCOUNTER — Other Ambulatory Visit: Payer: Self-pay

## 2015-07-22 ENCOUNTER — Ambulatory Visit (HOSPITAL_COMMUNITY): Payer: Managed Care, Other (non HMO) | Attending: Cardiovascular Disease

## 2015-07-22 DIAGNOSIS — E785 Hyperlipidemia, unspecified: Secondary | ICD-10-CM | POA: Diagnosis not present

## 2015-07-22 DIAGNOSIS — I517 Cardiomegaly: Secondary | ICD-10-CM | POA: Diagnosis not present

## 2015-07-22 DIAGNOSIS — I429 Cardiomyopathy, unspecified: Secondary | ICD-10-CM

## 2015-07-22 DIAGNOSIS — I428 Other cardiomyopathies: Secondary | ICD-10-CM

## 2015-09-18 ENCOUNTER — Other Ambulatory Visit: Payer: Self-pay | Admitting: Physician Assistant

## 2015-09-19 ENCOUNTER — Other Ambulatory Visit: Payer: Self-pay | Admitting: Physician Assistant

## 2015-11-21 ENCOUNTER — Other Ambulatory Visit: Payer: Self-pay | Admitting: Cardiovascular Disease

## 2015-11-23 ENCOUNTER — Emergency Department (HOSPITAL_BASED_OUTPATIENT_CLINIC_OR_DEPARTMENT_OTHER): Payer: Managed Care, Other (non HMO)

## 2015-11-23 DIAGNOSIS — Z5321 Procedure and treatment not carried out due to patient leaving prior to being seen by health care provider: Secondary | ICD-10-CM | POA: Diagnosis not present

## 2015-11-23 DIAGNOSIS — R002 Palpitations: Secondary | ICD-10-CM | POA: Diagnosis not present

## 2015-11-23 LAB — BASIC METABOLIC PANEL
Anion gap: 6 (ref 5–15)
BUN: 15 mg/dL (ref 6–20)
CALCIUM: 8.9 mg/dL (ref 8.9–10.3)
CO2: 28 mmol/L (ref 22–32)
CREATININE: 1.18 mg/dL (ref 0.61–1.24)
Chloride: 104 mmol/L (ref 101–111)
GFR calc non Af Amer: >60 mL/min (ref >60)
Glucose, Bld: 169 mg/dL — ABNORMAL HIGH (ref 65–99)
Potassium: 3.8 mmol/L (ref 3.5–5.1)
Sodium: 138 mmol/L (ref 135–145)

## 2015-11-23 LAB — CBC
HCT: 39.3 % (ref 39.0–52.0)
Hemoglobin: 13.4 g/dL (ref 13.0–17.0)
MCH: 27.7 pg (ref 26.0–34.0)
MCHC: 34.1 g/dL (ref 30.0–36.0)
MCV: 81.2 fL (ref 78.0–100.0)
PLATELETS: 281 10*3/uL (ref 150–400)
RBC: 4.84 MIL/uL (ref 4.22–5.81)
RDW: 12.5 % (ref 11.5–15.5)
WBC: 9.7 10*3/uL (ref 4.0–10.5)

## 2015-11-23 LAB — TROPONIN I

## 2015-11-23 NOTE — ED Notes (Signed)
Heart palpitation x 1 hour.  Denies N/V/SOB.  Reports that when it happens he feels like he needs to cough.

## 2015-11-24 ENCOUNTER — Emergency Department (HOSPITAL_BASED_OUTPATIENT_CLINIC_OR_DEPARTMENT_OTHER)
Admission: EM | Admit: 2015-11-24 | Discharge: 2015-11-24 | Disposition: A | Discharge status: Home / Self Care | Payer: Managed Care, Other (non HMO) | Attending: Dermatology | Admitting: Dermatology

## 2015-11-24 NOTE — ED Notes (Signed)
No answer x 2 in waiting area.

## 2015-11-24 NOTE — ED Notes (Signed)
No answer when being called and unable to locate at the other end of the waiting room.

## 2016-09-02 ENCOUNTER — Other Ambulatory Visit: Payer: Self-pay

## 2016-09-02 MED ORDER — ROSUVASTATIN CALCIUM 5 MG PO TABS: ORAL_TABLET | ORAL | 0 refills | Status: DC

## 2016-09-13 DIAGNOSIS — N182 Chronic kidney disease, stage 2 (mild): Secondary | ICD-10-CM | POA: Insufficient documentation

## 2016-09-13 DIAGNOSIS — R739 Hyperglycemia, unspecified: Secondary | ICD-10-CM | POA: Insufficient documentation

## 2016-10-30 ENCOUNTER — Other Ambulatory Visit: Payer: Self-pay | Admitting: Cardiovascular Disease

## 2016-11-03 ENCOUNTER — Telehealth: Payer: Self-pay | Admitting: Cardiovascular Disease

## 2016-11-03 MED ORDER — LISINOPRIL 2.5 MG PO TABS: 2.5000 mg | ORAL_TABLET | Freq: Every day | ORAL | 0 refills | Status: DC

## 2016-11-03 MED ORDER — ROSUVASTATIN CALCIUM 5 MG PO TABS: ORAL_TABLET | ORAL | 0 refills | Status: DC

## 2016-11-03 NOTE — Telephone Encounter (Signed)
New Message     *STAT* If patient is at the pharmacy, call can be transferred to refill team.   1. Which medications need to be refilled? (please list name of each medication and dose if known)  lisinopril (PRINIVIL,ZESTRIL) 2.5 MG tablet TAKE 1 TABLET BY MOUTH EVERY DAY   Call into [facility] [place] [address]   rosuvastatin (CRESTOR) 5 MG tablet TAKE 1 TABLET BY MOUTH DAILY AT 6 PM*PATIENT IS OVERDUE FOR AN APPOINTMENT AND NEEDS TO CALL AND SCHEDULE FOR FURTHER REFILLS*   Call into [facility] [address]   2. Which pharmacy/location (including street and city if local pharmacy) is medication to be sent to?  3. Do they need a 30 day or 90 day supply? 90  Has appt July 11

## 2016-11-21 ENCOUNTER — Encounter: Payer: Self-pay | Admitting: Cardiovascular Disease

## 2017-01-12 ENCOUNTER — Ambulatory Visit: Payer: Managed Care, Other (non HMO) | Admitting: Cardiovascular Disease

## 2017-01-14 ENCOUNTER — Other Ambulatory Visit: Payer: Self-pay | Admitting: Cardiovascular Disease

## 2017-01-23 ENCOUNTER — Other Ambulatory Visit: Payer: Self-pay

## 2017-01-23 MED ORDER — ROSUVASTATIN CALCIUM 5 MG PO TABS: ORAL_TABLET | ORAL | 0 refills | Status: DC

## 2017-02-14 NOTE — Progress Notes (Signed)
Patient ID: Kanyon Seibold, male   DOB: 1970/08/08, 46 y.o.   MRN: [id number]    Cardiology Office Note  Date:  02/16/2017   Patient ID:  Davarion, Cuffee 1970/10/08, MRN [id number]  PCP:  Patient, No Pcp Per  Cardiologist:  Eden Emms  Chief Complaint: here for follow-up post-hospital of recent chest pain  History of Present Illness: Amanuel Sinkfield is a 46 y.o. male with history of DCM and hyperlipidemia.  He was admitted to [facility] 1/18-1/20/16 for evaluation of chest pain. [name] He underwent a left heart cath which revealed normal coronary arteries and filling pressures with an EF of 45%.  2D echo was also completed and revealed EF of 45-50%, mild diffuse hypokinesis, G1DD, normal RV, no significant valvular disease. 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 He was started back on a trial of low dose Crestor since lipids were deranged with trig 338, HDL 38, LDL 125, tchol 231. Lisinopril 2.5 mg was added.  His Cr also ranged 1.27-1.4, c/w CKD stage II and stable from 1 year ago. BMET also revealed mild elevation of blood sugar to 121.  He was last seen by cardiology in 2016  Reviewed last echo 07/22/15 EF normalized 55-60% mild to moderate LaE And mild RV enlargement   Working at [employer] having issues with lower back pain    Past Medical History:  Diagnosis Date  . CKD (chronic kidney disease), stage II   . Dyslipidemia   . Hyperglycemia   . NICM (nonischemic cardiomyopathy) (HCC)    a. 07/2014: EF 45%, normal coronaries. ?Viral cardiomyopathy.    Past Surgical History:  Procedure Laterality Date  . LEFT HEART CATHETERIZATION WITH CORONARY ANGIOGRAM N/A 07/22/2014   Procedure: LEFT HEART CATHETERIZATION WITH CORONARY ANGIOGRAM;  Surgeon: Marykay Lex, MD;  Location: [facility] CATH LAB;  Service: Cardiovascular;  Laterality: N/A;    Current Outpatient Prescriptions  Medication Sig Dispense Refill  . diclofenac sodium (VOLTAREN) 1 % GEL Apply 1 application of 4 grams to rt knee twice to  three times a day as needed for pain  1  . Multiple Vitamin (MULTI-VITAMIN PO) Take 1 tablet by mouth daily.    . rosuvastatin (CRESTOR) 5 MG tablet TAKE 1 TABLET BY MOUTH DAILY AT 6 PM. KEEP August APPT FOR FURTHER REFILLS, CAN REQUEST 3 MONTH THEN. 30 tablet 0   No current facility-administered medications for this visit.     Allergies:   Meloxicam; Statins; and Etodolac   Social History:  The patient  reports that he is a non-smoker but has been exposed to tobacco smoke. His smokeless tobacco use includes Chew. He reports that he does not drink alcohol or use drugs.   Family History:  The patient's family history includes Cancer in his father; Coronary artery disease (age of onset: 76) in his mother; Diabetes in his mother.  ROS:  Please see the history of present illness. All other systems are reviewed and otherwise negative.   PHYSICAL EXAM:  VS:  BP 136/74   Pulse 67   Ht 6' (1.829 m)   Wt 285 lb 6 oz (129.4 kg)   SpO2 97%   BMI 38.70 kg/m  BMI: Body mass index is 38.7 kg/m. Affect appropriate Overweight black male  HEENT: normal Neck supple with no adenopathy JVP normal no bruits no thyromegaly Lungs clear with no wheezing and good diaphragmatic motion Heart:  S1/S2 no murmur, no rub, gallop or click PMI normal Abdomen: benighn, BS positve, no tenderness, no AAA  no bruit.  No HSM or HJR Distal pulses intact with no bruits No edema Neuro non-focal Skin warm and dry No muscular weakness  EKG:   08/07/14  NSR 66bpm, sinus arrhythmia, nonspecific ST-T changes, no acute change from prior 02/16/17 Sr rate 67 normal  Recent Labs: No results found for requested labs within last 8760 hours.  No results found for requested labs within last 8760 hours.   CrCl cannot be calculated (Patient's most recent lab result is older than the maximum 21 days allowed.).   Wt Readings from Last 3 Encounters:  02/16/17 285 lb 6 oz (129.4 kg)  11/23/15 282 lb (127.9 kg)  06/10/15 280 lb  12.8 oz (127.4 kg)     Other studies reviewed: Additional studies/records reviewed today include: cath as above.  ASSESSMENT AND PLAN:  1. NICM - question viral cardiomyopathy. EF normalized echo 2017 ontinue ACEI. EF normalized consider f/u EF evaluation in a year. Increase Lisinopril 5 mg daily 2. Dyslipidemia - continue Crestor. Labs with primary  3. Hyperglycemia - the patient has follow-up with his PCP check A1c low carb diet  4. Hypokalemia - resolved on [facility] labs  5. Obesity  Improved  6. Palpitations:  Benign sounding not sustained or frequent observe Event monitor if worsen 7. CRF  Cr baseline around 1.3  F/u primary   Current medicines are reviewed at length with the patient today.  The patient did not have any concerns regarding medicines.  F/U with me in a year   He has not been compliant with statin Will check labs in 6 weeks if he takes both meds   Charlton Haws

## 2017-02-16 ENCOUNTER — Ambulatory Visit (INDEPENDENT_AMBULATORY_CARE_PROVIDER_SITE_OTHER): Payer: 59 | Admitting: Cardiovascular Disease

## 2017-02-16 ENCOUNTER — Encounter: Payer: Self-pay | Admitting: Cardiovascular Disease

## 2017-02-16 VITALS — BP 136/74 | HR 67 | Ht 72.0 in | Wt 285.4 lb

## 2017-02-16 DIAGNOSIS — I428 Other cardiomyopathies: Secondary | ICD-10-CM

## 2017-02-16 DIAGNOSIS — E785 Hyperlipidemia, unspecified: Secondary | ICD-10-CM | POA: Diagnosis not present

## 2017-02-16 MED ORDER — LISINOPRIL 5 MG PO TABS: 5.0000 mg | ORAL_TABLET | Freq: Every day | ORAL | 11 refills | Status: DC

## 2017-02-16 MED ORDER — ROSUVASTATIN CALCIUM 5 MG PO TABS: ORAL_TABLET | ORAL | 11 refills | Status: DC

## 2017-02-16 NOTE — Patient Instructions (Addendum)
Medication Instructions:  1. INCREASE LISINOPRIL TO 5 MG DAILY; NEW RX HAS BEEN SENT IN  2. MAKE SURE TO TAKE YOUR CRESTOR  Labwork: 03/30/17 COME IN FIRST THING IN THE MORNING FASTING LIPID AND CMET TO CHECK YOUR CHOLESTEROL  Testing/Procedures: NONE ORDERED   Follow-Up: Your physician wants you to follow-up in: 1 YEAR WITH DR. Haywood Filler will receive a reminder letter in the mail two months in advance. If you don't receive a letter, please call our office to schedule the follow-up appointment.   Any Other Special Instructions Will Be Listed Below (If Applicable).     If you need a refill on your cardiac medications before your next appointment, please call your pharmacy.

## 2017-03-30 ENCOUNTER — Other Ambulatory Visit: Payer: 59

## 2017-03-30 ENCOUNTER — Telehealth: Payer: Self-pay | Admitting: Cardiovascular Disease

## 2017-03-30 NOTE — Telephone Encounter (Signed)
New Message    Pt c/o medication issue:  1. Name of Medication:   rosuvastatin (CRESTOR) 5 MG tablet TAKE 1 TABLET BY MOUTH DAILY AT 6 PM. KEEP August APPT FOR FURTHER REFILLS, CAN REQUEST 3 MONTH THEN.   lisinopril (PRINIVIL,ZESTRIL) 5 MG tablet Take 1 tablet (5 mg total) by mouth daily.     2. How are you currently taking this medication (dosage and times per day)?  As prescribed  3. Are you having a reaction (difficulty breathing--STAT)? no  4. What is your medication issue? Can he take OTC weightloss medication like hydroxycut of something of that nature to lose weight ?

## 2017-03-30 NOTE — Telephone Encounter (Signed)
He has no CAD ok to use hydroxycut

## 2017-03-30 NOTE — Telephone Encounter (Signed)
Will forward patient's question to Dr. Eden Emms.

## 2017-03-31 ENCOUNTER — Other Ambulatory Visit: Payer: 59 | Admitting: *Deleted

## 2017-03-31 DIAGNOSIS — E785 Hyperlipidemia, unspecified: Secondary | ICD-10-CM

## 2017-03-31 LAB — COMPREHENSIVE METABOLIC PANEL
A/G RATIO: 1.6 (ref 1.2–2.2)
ALBUMIN: 4.5 g/dL (ref 3.5–5.5)
ALT: 35 IU/L (ref 0–44)
AST: 27 IU/L (ref 0–40)
Alkaline Phosphatase: 49 IU/L (ref 39–117)
BILIRUBIN TOTAL: 0.7 mg/dL (ref 0.0–1.2)
BUN / CREAT RATIO: 15 (ref 9–20)
BUN: 18 mg/dL (ref 6–24)
CALCIUM: 9.5 mg/dL (ref 8.7–10.2)
CHLORIDE: 98 mmol/L (ref 96–106)
CO2: 24 mmol/L (ref 20–29)
Creatinine, Ser: 1.18 mg/dL (ref 0.76–1.27)
GFR, EST AFRICAN AMERICAN: 86 mL/min/{1.73_m2} (ref >59)
GFR, EST NON AFRICAN AMERICAN: 75 mL/min/{1.73_m2} (ref >59)
GLOBULIN, TOTAL: 2.9 g/dL (ref 1.5–4.5)
Glucose: 101 mg/dL — ABNORMAL HIGH (ref 65–99)
POTASSIUM: 4.5 mmol/L (ref 3.5–5.2)
SODIUM: 137 mmol/L (ref 134–144)
TOTAL PROTEIN: 7.4 g/dL (ref 6.0–8.5)

## 2017-03-31 LAB — LIPID PANEL
CHOL/HDL RATIO: 3.4 ratio (ref 0.0–5.0)
Cholesterol, Total: 163 mg/dL (ref 100–199)
HDL: 48 mg/dL (ref >39)
LDL Calculated: 93 mg/dL (ref 0–99)
Triglycerides: 109 mg/dL (ref 0–149)
VLDL Cholesterol Cal: 22 mg/dL (ref 5–40)

## 2017-03-31 NOTE — Telephone Encounter (Signed)
Called patient and informed him of Dr. Fabio Bering message. Patient verbalized understanding.

## 2017-05-14 ENCOUNTER — Encounter (HOSPITAL_BASED_OUTPATIENT_CLINIC_OR_DEPARTMENT_OTHER): Payer: Self-pay | Admitting: Emergency Medicine

## 2017-05-14 ENCOUNTER — Other Ambulatory Visit: Payer: Self-pay

## 2017-05-14 ENCOUNTER — Emergency Department (HOSPITAL_BASED_OUTPATIENT_CLINIC_OR_DEPARTMENT_OTHER)
Admission: EM | Admit: 2017-05-14 | Discharge: 2017-05-14 | Disposition: A | Discharge status: Home / Self Care | Payer: 59 | Attending: Emergency Medicine | Admitting: Emergency Medicine

## 2017-05-14 DIAGNOSIS — Z7722 Contact with and (suspected) exposure to environmental tobacco smoke (acute) (chronic): Secondary | ICD-10-CM | POA: Diagnosis not present

## 2017-05-14 DIAGNOSIS — M5431 Sciatica, right side: Secondary | ICD-10-CM | POA: Diagnosis not present

## 2017-05-14 DIAGNOSIS — M545 Low back pain: Secondary | ICD-10-CM | POA: Diagnosis present

## 2017-05-14 DIAGNOSIS — Z79899 Other long term (current) drug therapy: Secondary | ICD-10-CM | POA: Diagnosis not present

## 2017-05-14 DIAGNOSIS — N182 Chronic kidney disease, stage 2 (mild): Secondary | ICD-10-CM | POA: Insufficient documentation

## 2017-05-14 MED ORDER — CYCLOBENZAPRINE HCL 10 MG PO TABS
10.0000 mg | ORAL_TABLET | Freq: Once | ORAL | Status: AC
  Administered 2017-05-14: 10 mg via ORAL
  Filled 2017-05-14: qty 1

## 2017-05-14 MED ORDER — KETOROLAC TROMETHAMINE 60 MG/2ML IM SOLN
60.0000 mg | Freq: Once | INTRAMUSCULAR | Status: AC
  Administered 2017-05-14: 60 mg via INTRAMUSCULAR
  Filled 2017-05-14: qty 2

## 2017-05-14 MED ORDER — PREDNISONE 20 MG PO TABS
ORAL_TABLET | ORAL | 0 refills | Status: DC
Start: 2017-05-14 — End: 2017-09-27

## 2017-05-14 MED ORDER — NAPROXEN 500 MG PO TABS
500.0000 mg | ORAL_TABLET | Freq: Two times a day (BID) | ORAL | 0 refills | Status: DC
Start: 2017-05-14 — End: 2017-05-14

## 2017-05-14 MED ORDER — CYCLOBENZAPRINE HCL 10 MG PO TABS: 10.0000 mg | ORAL_TABLET | Freq: Two times a day (BID) | ORAL | 0 refills | Status: DC | PRN

## 2017-05-14 NOTE — ED Provider Notes (Signed)
[facility] EMERGENCY DEPARTMENT Provider Note   CSN: [id number] Arrival date & time: 05/14/17  [id number]     History   Chief Complaint Chief Complaint  Patient presents with  . Back Pain    HPI Scheryl MartenCedric Bruce is a 46 y.o. male.  Patient with history of sciatica presents with typical sciatica pain starting acutely approximately 6 PM tonight.  Pain is described as burning.  No bending or other injury at time of onset.  The pain is in his right lower back and radiates down his right leg.  One pain hit him, he could not move and laid on a porch until he can get up.  Pain is better when he is lying flat.  He is currently undergoing physical therapy for this problem and has therapy sessions every Wednesday.  He takes naproxen at home.  History of chronic kidney disease, last creatinine 9/18 was normal per records.  Onset of symptoms acute.  Course is gradually improving.  Nothing makes symptoms better or worse.  Patient has had improvement with prednisone in the past.      Past Medical History:  Diagnosis Date  . CKD (chronic kidney disease), stage II   . Dyslipidemia   . Hyperglycemia   . NICM (nonischemic cardiomyopathy) (HCC)    a. 07/2014: EF 45%, normal coronaries. ?Viral cardiomyopathy.    Patient Active Problem List   Diagnosis Date Noted  . Hypokalemia 08/07/2014  . Hyperglycemia   . CKD (chronic kidney disease), stage II   . Nonischemic cardiomyopathy (HCC) 07/23/2014  . Dyslipidemia 07/22/2014  . Chest pain 07/22/2014    History reviewed. No pertinent surgical history.     Home Medications    Prior to Admission medications   Medication Sig Start Date End Date Taking? Authorizing Provider  diclofenac sodium (VOLTAREN) 1 % GEL Apply 1 application of 4 grams to rt knee twice to three times a day as needed for pain 05/20/15   [provider]  lisinopril (PRINIVIL,ZESTRIL) 5 MG tablet Take 1 tablet (5 mg total) by mouth daily. 02/16/17 05/17/17   Wendall StadeNishan, Peter C, MD  Multiple Vitamin (MULTI-VITAMIN PO) Take 1 tablet by mouth daily.    [provider]  rosuvastatin (CRESTOR) 5 MG tablet TAKE 1 TABLET BY MOUTH DAILY AT 6 PM. KEEP August APPT FOR FURTHER REFILLS, CAN REQUEST 3 MONTH THEN. 02/16/17   Wendall StadeNishan, Peter C, MD    Family History Family History  Problem Relation Age of Onset  . Coronary artery disease Mother 7045       MI  . Diabetes Mother   . Cancer Father     Social History Social History   Tobacco Use  . Smoking status: Passive Smoke Exposure - Never Smoker  . Smokeless tobacco: Current User    Types: Chew  Substance Use Topics  . Alcohol use: No  . Drug use: No     Allergies   Meloxicam; Statins; and Etodolac   Review of Systems Review of Systems  Constitutional: Negative for fever and unexpected weight change.  Gastrointestinal: Negative for constipation.       Neg for fecal incontinence  Genitourinary: Negative for difficulty urinating, flank pain and hematuria.       Negative for urinary incontinence or retention  Musculoskeletal: Positive for back pain.  Neurological: Negative for weakness and numbness.       Negative for saddle paresthesias      Physical Exam Updated Vital Signs BP (!) 141/94 (BP Location: Right  Arm)   Pulse 66   Temp 98 F (36.7 C) (Oral)   Resp 18   SpO2 97%   Physical Exam  Constitutional: He appears well-developed and well-nourished.  HENT:  Head: Normocephalic and atraumatic.  Eyes: Conjunctivae are normal.  Neck: Normal range of motion.  Abdominal: Soft. There is no tenderness. There is no CVA tenderness.  Musculoskeletal: Normal range of motion.       Cervical back: He exhibits normal range of motion, no tenderness and no bony tenderness.       Thoracic back: He exhibits normal range of motion, no tenderness and no bony tenderness.       Lumbar back: He exhibits tenderness. He exhibits normal range of motion and no bony tenderness.       Back:  No  step-off noted with palpation of spine.   Neurological: He is alert. He has normal reflexes. No sensory deficit. He exhibits normal muscle tone.  5/5 strength in entire lower extremities bilaterally. No sensation deficit.   Skin: Skin is warm and dry.  Psychiatric: He has a normal mood and affect.  Nursing note and vitals reviewed.    ED Treatments / Results   Procedures Procedures (including critical care time)  Medications Ordered in ED Medications  ketorolac (TORADOL) injection 60 mg (not administered)  cyclobenzaprine (FLEXERIL) tablet 10 mg (not administered)     Initial Impression / Assessment and Plan / ED Course  I have reviewed the triage vital signs and the nursing notes.  Pertinent labs & imaging results that were available during my care of the patient were reviewed by me and considered in my medical decision making (see chart for details).     9:50 PM Patient seen and examined. Work-up initiated. Medications ordered.   Vital signs reviewed and are as follows: Vitals:   05/14/17 1920  BP: (!) 141/94  Pulse: 66  Resp: 18  Temp: 98 F (36.7 C)  SpO2: 97%    11:24 PM Pt feels better. His ride is here. Requesting d/c. No red flag s/s of low back pain. Patient was counseled on back pain precautions and told to do activity as tolerated but do not lift, push, or pull heavy objects more than 10 pounds for the next week.  Patient counseled to use ice or heat on back for no longer than 15 minutes every hour.   Patient counseled on proper use of muscle relaxant medication.  They were told not to drink alcohol, drive any vehicle, or do any dangerous activities while taking this medication.  Patient verbalized understanding.  Patient urged to follow-up with PCP if pain does not improve with treatment and rest or if pain becomes recurrent. Urged to return with worsening severe pain, loss of bowel or bladder control, trouble walking.   The patient verbalizes understanding  and agrees with the plan.   Final Clinical Impressions(s) / ED Diagnoses   Final diagnoses:  Sciatica of right side   Patient with back pain with sciatica. No neurological deficits. Patient is ambulatory. No warning symptoms of back pain including: fecal incontinence, urinary retention or overflow incontinence, night sweats, waking from sleep with back pain, unexplained fevers or weight loss, h/o cancer, IVDU, recent trauma. No concern for cauda equina, epidural abscess, or other serious cause of back pain. Conservative measures such as rest, ice/heat and pain medicine indicated with PCP follow-up if no improvement with conservative management.    ED Discharge Orders        Ordered  cyclobenzaprine (FLEXERIL) 10 MG tablet  2 times daily PRN     05/14/17 2318    predniSONE (DELTASONE) 20 MG tablet     05/14/17 2318    naproxen (NAPROSYN) 500 MG tablet  2 times daily,   Status:  Discontinued     05/14/17 2318       Renne Crigler, PA-C 05/14/17 2325    Raeford Razor, MD 05/16/17 1103

## 2017-05-14 NOTE — ED Notes (Signed)
Pt teaching provided on medications that may cause drowsiness. Pt instructed not to drive or operate heavy machinery while taking the prescribed medication. Pt verbalized understanding.   

## 2017-05-14 NOTE — Discharge Instructions (Signed)
Please read and follow all provided instructions.  Your diagnoses today include:  1. Sciatica of right side    Tests performed today include:  Vital signs - see below for your results today  Medications prescribed:   Prednisone - steroid medicine   It is best to take this medication in the morning to prevent sleeping problems. If you are diabetic, monitor your blood sugar closely and stop taking Prednisone if blood sugar is over 300. Take with food to prevent stomach upset.    Flexeril (cyclobenzaprine) - muscle relaxer medication  DO NOT drive or perform any activities that require you to be awake and alert because this medicine can make you drowsy.   Take any prescribed medications only as directed.  Home care instructions:   Follow any educational materials contained in this packet  Please rest, use ice or heat on your back for the next several days  Do not lift, push, pull anything more than 10 pounds for the next week  Follow-up instructions: Please follow-up with your primary care provider in the next 1 week for further evaluation of your symptoms.   Return instructions:  SEEK IMMEDIATE MEDICAL ATTENTION IF YOU HAVE:  New numbness, tingling, weakness, or problem with the use of your arms or legs  Severe back pain not relieved with medications  Loss control of your bowels or bladder  Increasing pain in any areas of the body (such as chest or abdominal pain)  Shortness of breath, dizziness, or fainting.   Worsening nausea (feeling sick to your stomach), vomiting, fever, or sweats  Any other emergent concerns regarding your health   Additional Information:  Your vital signs today were: BP (!) 141/94 (BP Location: Right Arm)    Pulse 66    Temp 98 F (36.7 C) (Oral)    Resp 18    SpO2 97%  If your blood pressure (BP) was elevated above 135/85 this visit, please have this repeated by your doctor within one month. --------------

## 2017-05-14 NOTE — ED Triage Notes (Signed)
PT presents with c/o right lower back pain going down thru his right leg, with burning thru his leg.

## 2017-09-25 ENCOUNTER — Telehealth: Payer: Self-pay | Admitting: Cardiovascular Disease

## 2017-09-25 NOTE — Telephone Encounter (Signed)
New Message    Pt c/o medication issue:  1. Name of Medication: nitro oxide    2. How are you currently taking this medication (dosage and times per day)?   3. Are you having a reaction (difficulty breathing--STAT)?   4. What is your medication issue? Patient is starting to work out again and would like to know can he take Nitro oxide. Please call to advise.

## 2017-09-25 NOTE — Telephone Encounter (Signed)
Left message for patient to call back  

## 2017-09-27 NOTE — Telephone Encounter (Signed)
Called patient with Dr. Eden Emms recommendations. Per Dr. Eden Emms, okay to take nitric oxide would have his primary check his testosterone level and only take supplement if low. Patient verbalized understanding.

## 2017-09-27 NOTE — Telephone Encounter (Signed)
He has no CAD and EF returned to normal. Ok to take nitric oxide would have his primary check his testosterone level and only take supplement if low

## 2017-09-27 NOTE — Telephone Encounter (Signed)
Left message for patient to call back. Will send a message to Dr. Eden Emms about supplements.   Patient wants to know if he can take nitro oxide and nugenix "natural testosterone booster".

## 2017-09-27 NOTE — Telephone Encounter (Signed)
F/U Call: ° °Patient returning your call °

## 2017-09-27 NOTE — Telephone Encounter (Signed)
New message     Pt came in today about call from the other day. Pt also wants to know if it is ok to take nugenics also with his medications?

## 2017-09-27 NOTE — Telephone Encounter (Signed)
Called patient back. Informed patient that a message has been sent to Dr. Eden Emms. Updated patient's medication list while on the phone.

## 2018-02-06 NOTE — Progress Notes (Signed)
Patient ID: Sahith Nurse, male   DOB: 08-20-1970, 47 y.o.   MRN: [id number]    Cardiology Office Note  Date:  02/07/2018   Patient ID:  Wilmer, Santillo 02-08-1971, MRN [id number]  PCP:  Patient, No Pcp Per  Cardiologist:  Eden Emms  Chief Complaint: here for follow-up post-hospital of recent chest pain  History of Present Illness: Olof Marcil is a 47 y.o. male with history of DCM and hyperlipidemia.  He was admitted to [facility] 1/18-1/20/16 for evaluation of chest pain. [name] He underwent a left heart cath which revealed normal coronary arteries and filling pressures with an EF of 45%.  2D echo was also completed and revealed EF of 45-50%, mild diffuse hypokinesis, G1DD, normal RV, no significant valvular disease. 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 He was started back on a trial of low dose Crestor since lipids were elevated with trig 338, HDL 38, LDL 125, tchol 231. Lisinopril 2.5 mg was added.  His Cr also ranged 1.27-1.4, c/w CKD stage II and stable from 1 year ago. BMET also revealed mild elevation of blood sugar to 121.  Reviewed last echo 07/22/15 EF normalized 55-60% mild to moderate LaE And mild RV enlargement   Working at [employer] having issues with lower back pain Wanted to use nitric oxide and take supplemental testosterone  Has some arthritic type pain in hands left worse than right as he is Left handed. Getting blood work with primary in 2 months     Past Medical History:  Diagnosis Date  . CKD (chronic kidney disease), stage II   . Dyslipidemia   . Hyperglycemia   . NICM (nonischemic cardiomyopathy) (HCC)    a. 07/2014: EF 45%, normal coronaries. ?Viral cardiomyopathy.    Past Surgical History:  Procedure Laterality Date  . LEFT HEART CATHETERIZATION WITH CORONARY ANGIOGRAM N/A 07/22/2014   Procedure: LEFT HEART CATHETERIZATION WITH CORONARY ANGIOGRAM;  Surgeon: Marykay Lex, MD;  Location: [facility] CATH LAB;  Service: Cardiovascular;  Laterality: N/A;    Current  Outpatient Medications  Medication Sig Dispense Refill  . lisinopril (PRINIVIL,ZESTRIL) 5 MG tablet Take 1 tablet (5 mg total) by mouth daily. 30 tablet 11  . Multiple Vitamin (MULTI-VITAMIN PO) Take 1 tablet by mouth daily.    . rosuvastatin (CRESTOR) 5 MG tablet TAKE 1 TABLET BY MOUTH DAILY AT 6 PM. KEEP August APPT FOR FURTHER REFILLS, CAN REQUEST 3 MONTH THEN. 30 tablet 11   No current facility-administered medications for this visit.     Allergies:   Meloxicam; Statins; and Etodolac   Social History:  The patient  reports that he is a non-smoker but has been exposed to tobacco smoke. His smokeless tobacco use includes chew. He reports that he does not drink alcohol or use drugs.   Family History:  The patient's family history includes Cancer in his father; Coronary artery disease (age of onset: 59) in his mother; Diabetes in his mother.  ROS:  Please see the history of present illness. All other systems are reviewed and otherwise negative.   PHYSICAL EXAM:  VS:  BP 110/70   Pulse 73   Ht 6' (1.829 m)   Wt 286 lb 8 oz (130 kg)   SpO2 98%   BMI 38.86 kg/m  BMI: Body mass index is 38.86 kg/m. Affect appropriate Overweight black male  HEENT: normal Neck supple with no adenopathy JVP normal no bruits no thyromegaly Lungs clear with no wheezing and good diaphragmatic motion Heart:  S1/S2 no  murmur, no rub, gallop or click PMI normal Abdomen: benighn, BS positve, no tenderness, no AAA no bruit.  No HSM or HJR Distal pulses intact with no bruits No edema Neuro non-focal Skin warm and dry No muscular weakness  EKG:   02/07/18 NSR rate 63 normal   Recent Labs: 03/31/2017: ALT 35; BUN 18; Creatinine, Ser 1.18; Potassium 4.5; Sodium 137  03/31/2017: Chol/HDL Ratio 3.4; Cholesterol, Total 163; HDL 48; LDL Calculated 93; Triglycerides 109   CrCl cannot be calculated (Patient's most recent lab result is older than the maximum 21 days allowed.).   Wt Readings from Last 3  Encounters:  02/07/18 286 lb 8 oz (130 kg)  02/16/17 285 lb 6 oz (129.4 kg)  11/23/15 282 lb (127.9 kg)     Other studies reviewed: Additional studies/records reviewed today include: cath as above.  ASSESSMENT AND PLAN:  1. NICM - question viral cardiomyopathy. EF normalized echo 2017 continue ACEI. Since not having heart failure symptoms no need for echo  2. Dyslipidemia - continue Crestor. Labs with primary  3. Hyperglycemia - the patient has follow-up with his PCP check A1c low carb diet  4. Hypokalemia - resolved on [facility] labs  5. Obesity  Improved  6. Palpitations:  Benign sounding not sustained or frequent observe Event monitor if worsen 7. CRF  Cr baseline around 1.3  F/u primary   Current medicines are reviewed at length with the patient today.  The patient did not have any concerns regarding medicines.  F/U with me in a year    Charlton Haws

## 2018-02-07 ENCOUNTER — Ambulatory Visit: Payer: Managed Care, Other (non HMO) | Admitting: Cardiovascular Disease

## 2018-02-07 ENCOUNTER — Encounter (INDEPENDENT_AMBULATORY_CARE_PROVIDER_SITE_OTHER): Payer: Self-pay

## 2018-02-07 ENCOUNTER — Encounter: Payer: Self-pay | Admitting: Cardiovascular Disease

## 2018-02-07 VITALS — BP 110/70 | HR 73 | Ht 72.0 in | Wt 286.5 lb

## 2018-02-07 DIAGNOSIS — E785 Hyperlipidemia, unspecified: Secondary | ICD-10-CM

## 2018-02-07 DIAGNOSIS — R002 Palpitations: Secondary | ICD-10-CM | POA: Diagnosis not present

## 2018-02-07 DIAGNOSIS — I428 Other cardiomyopathies: Secondary | ICD-10-CM

## 2018-02-07 NOTE — Patient Instructions (Addendum)

## 2018-03-11 ENCOUNTER — Other Ambulatory Visit: Payer: Self-pay | Admitting: Cardiovascular Disease

## 2018-03-21 DIAGNOSIS — E669 Obesity, unspecified: Secondary | ICD-10-CM | POA: Insufficient documentation

## 2018-04-03 NOTE — Progress Notes (Signed)
Cardiology Office Note:    Date:  04/04/2018   ID:  Philip Bruce, DOB 18-Oct-1970, MRN [id number]  PCP:  Karen Kays, NP  Cardiologist:  Charlton Haws, MD   Electrophysiologist:  None   Referring MD: No ref. provider found   Chief Complaint  Patient presents with  . Hospitalization Follow-up    irregular heartbeat     History of Present Illness:    Sumit Branham is a 47 y.o. male with non-ischemic CM, CKD, hyperlipidemia.  His EF was 45-50 in 2016.  Cardiac Catheterization in 2016 demonstrated no CAD. His last echo in 2017 demonstrated normal LVF.  He was last seen by Dr. Eden Emms in 02/2018.  He was seen in the ED at [facility] 04/03/18 for palpitations.  He was seen by EP Clydie Braun, MD).  An ECG obtained by EMS showed a brief narrow complex rhythm with some aberrancy.  It was felt to be atrial tachycardia with 2:1 conduction vs NSR with PACs.  An echocardiogram demonstrated an EF 50-55.  He was placed on Diltiazem 120 QD.    Mr. Hoshino returns for follow-up.  He is here alone.  He has had heart palpitations for years.  He does notice that his palpitations are worse if he has been working outside and sweating and not drinking enough water.  He delivers furniture and appliances.  He denies chest pain, shortness of breath, syncope, orthopnea, PND or lower extremity swelling.  He does have some dizziness if he uses head quickly at times.  He denies any recent illnesses, vomiting, diarrhea, fevers.  Prior CV studies:   The following studies were reviewed today:  Echo 04/03/18 [facility] EF 50-55  Echo 07/22/15 EF 55-60, normal wall motion, normal diastolic function, mild to mod LAE  Echo 07/23/14 EF 45-50, diff HK, Gr 1 DD, normal RVSF, mild RAE  Cardiac Catheterization 07/22/14 Normal coronary arteries EF 45  Past Medical History:  Diagnosis Date  . CKD (chronic kidney disease), stage II   . Dyslipidemia   . Hyperglycemia   . NICM (nonischemic cardiomyopathy) (HCC)    a. 07/2014: EF  45%, normal coronaries. ?Viral cardiomyopathy.   Surgical Hx: The patient  has a past surgical history that includes left heart catheterization with coronary angiogram (N/A, 07/22/2014).   Current Medications: Current Meds  Medication Sig  . lisinopril (PRINIVIL,ZESTRIL) 5 MG tablet TAKE 1 TABLET BY MOUTH ONCE DAILY  . OVER THE COUNTER MEDICATION [employer] Mega Men Energy and Metabolism - Take two (2) capsules by mouth daily. Nugenix Ultimate Advanced Free Testosterone Complex - Take two (2) capsules by mouth twice daily.  . rosuvastatin (CRESTOR) 5 MG tablet TAKE 1 TABLET BY MOUTH ONCE DAILY AT  6  PM     Allergies:   Meloxicam; Statins; and Etodolac   Social History   Tobacco Use  . Smoking status: Passive Smoke Exposure - Never Smoker  . Smokeless tobacco: Current User    Types: Chew  Substance Use Topics  . Alcohol use: No  . Drug use: No     Family Hx: The patient's family history includes Cancer in his father; Coronary artery disease (age of onset: 34) in his mother; Diabetes in his mother.  ROS:   Please see the history of present illness.    Review of Systems  Cardiovascular: Positive for irregular heartbeat.   All other systems reviewed and are negative.   EKGs/Labs/Other Test Reviewed:    EKG:  EKG is  ordered today.  The ekg  ordered today demonstrates sinus bradycardia, heart rate 57, leftward axis, QTC 385, no change from prior tracing  Recent Labs: No results found for requested labs within last 8760 hours.   Recent Lipid Panel Lab Results  Component Value Date/Time   CHOL 163 03/31/2017 11:02 AM   TRIG 109 03/31/2017 11:02 AM   HDL 48 03/31/2017 11:02 AM   CHOLHDL 3.4 03/31/2017 11:02 AM   CHOLHDL 6.1 07/22/2014 12:32 AM   LDLCALC 93 03/31/2017 11:02 AM    Physical Exam:    VS:  BP 116/78   Pulse (!) 57   Ht 6' (1.829 m)   Wt 283 lb (128.4 kg)   BMI 38.38 kg/m     Wt Readings from Last 3 Encounters:  04/04/18 283 lb (128.4 kg)  02/07/18 286 lb  8 oz (130 kg)  02/16/17 285 lb 6 oz (129.4 kg)     Physical Exam  Constitutional: He is oriented to person, place, and time. He appears well-developed and well-nourished. No distress.  HENT:  Head: Normocephalic and atraumatic.  Eyes: No scleral icterus.  Neck: No JVD present. No thyromegaly present.  Cardiovascular: Normal rate and regular rhythm.  No murmur heard. Pulmonary/Chest: Effort normal. He has no rales.  Abdominal: Soft.  Musculoskeletal: He exhibits no edema.  Lymphadenopathy:    He has no cervical adenopathy.  Neurological: He is alert and oriented to person, place, and time.  Skin: Skin is warm and dry.  Psychiatric: He has a normal mood and affect.    ASSESSMENT & PLAN:    Tachycardia He does have a long history of heart palpitations.  Notes from [facility] hospital indicates that he had a brief episode of atrial tachycardia versus sinus rhythm with PACs.  There was some aberrancy noted.  Echocardiogram demonstrated EF 50-55%.  Of note, TSH and potassium at [facility] hospital was normal.  I have recommended proceeding with an event monitor.  I do not think his HR will tolerate Diltiazem CD 120 mg QD.  Also, with his history of cardiomyopathy, I would not have him take daily calcium channel blocker.  I will give him short acting Diltiazem to take as needed.  Follow-up with Dr. Eden Emms in 8 weeks (after his monitor).  -Event monitor x 30 days  -Diltiazem 30 mg QD prn for palpitations   Nonischemic cardiomyopathy (HCC) EF returned to normal on medical therapy.  He remains on ACE inhibitor only.  CKD (chronic kidney disease), stage II - Recent creatinine stable.  Dyslipidemia  Continue statin.  Dispo:  Return in about 8 weeks (around 05/30/2018) for Follow up after testing w/ Dr. Eden Emms.   Medication Adjustments/Labs and Tests Ordered: Current medicines are reviewed at length with the patient today.  Concerns regarding medicines are outlined  above.  Tests Ordered: Orders Placed This Encounter  Procedures  . LONG TERM MONITOR (3-14 DAYS)  . EKG 12-Lead   Medication Changes: Meds ordered this encounter  Medications  . diltiazem (CARDIZEM) 30 MG tablet    Sig: Take 1 tablet (30 mg total) by mouth daily as needed (palpitations).    Dispense:  30 tablet    Refill:  3    Signed, Tereso Newcomer, PA-C  04/04/2018 2:09 PM    [facility] [address] [place], [place], [place]  [address] Phone: [phone]; Fax: [phone]

## 2018-04-04 ENCOUNTER — Ambulatory Visit: Payer: Managed Care, Other (non HMO) | Admitting: Physician Assistant

## 2018-04-04 ENCOUNTER — Ambulatory Visit (INDEPENDENT_AMBULATORY_CARE_PROVIDER_SITE_OTHER): Payer: Managed Care, Other (non HMO)

## 2018-04-04 ENCOUNTER — Encounter (INDEPENDENT_AMBULATORY_CARE_PROVIDER_SITE_OTHER): Payer: Self-pay

## 2018-04-04 ENCOUNTER — Encounter: Payer: Self-pay | Admitting: Physician Assistant

## 2018-04-04 ENCOUNTER — Other Ambulatory Visit: Payer: Self-pay | Admitting: Physician Assistant

## 2018-04-04 VITALS — BP 116/78 | HR 57 | Ht 72.0 in | Wt 283.0 lb

## 2018-04-04 DIAGNOSIS — E785 Hyperlipidemia, unspecified: Secondary | ICD-10-CM | POA: Diagnosis not present

## 2018-04-04 DIAGNOSIS — N182 Chronic kidney disease, stage 2 (mild): Secondary | ICD-10-CM | POA: Diagnosis not present

## 2018-04-04 DIAGNOSIS — R000 Tachycardia, unspecified: Secondary | ICD-10-CM

## 2018-04-04 DIAGNOSIS — R002 Palpitations: Secondary | ICD-10-CM | POA: Diagnosis not present

## 2018-04-04 DIAGNOSIS — I428 Other cardiomyopathies: Secondary | ICD-10-CM | POA: Diagnosis not present

## 2018-04-04 MED ORDER — DILTIAZEM HCL 30 MG PO TABS: 30.0000 mg | ORAL_TABLET | Freq: Every day | ORAL | 3 refills | Status: AC | PRN

## 2018-04-04 NOTE — Patient Instructions (Addendum)
Medication Instructions:  Your physician has recommended you make the following change in your medication:  1.  START Diltiazem 30 mg taking 1 tablet daily only as needed for palpitations  Labwork: None ordered  Testing/Procedures: Your physician has recommended that you wear an longterm continuous monitor.  Follow-Up: Your physician recommends that you schedule a follow-up appointment in: 6-8 WEEKS WITH DR. Eden Emms   Any Other Special Instructions Will Be Listed Below (If Applicable).     If you need a refill on your cardiac medications before your next appointment, please call your pharmacy.

## 2018-04-09 ENCOUNTER — Telehealth: Payer: Self-pay

## 2018-04-09 NOTE — Telephone Encounter (Signed)
Called patient with critical monitor from 04/06/18 at 11:46 pm. Showing  Sinus rhythm, Ventricular Tachycardia (19 sec.). Patient stated he felt some fluttering while being out and about, getting out of the vehicle. DOD, Dr. Mayford Knife, reviewed monitor, stated he needed to follow-up tomorrow with Tereso Newcomer PA. No available appointments with Tereso Newcomer PA. Dr. Anne Fu, on Dr. Fabio Bering team has some opening appointments. Patient will come in tomorrow to see Dr. Anne Fu.

## 2018-04-10 ENCOUNTER — Encounter: Payer: Self-pay | Admitting: Cardiology

## 2018-04-10 ENCOUNTER — Ambulatory Visit: Payer: Managed Care, Other (non HMO) | Admitting: Cardiology

## 2018-04-10 VITALS — BP 110/76 | HR 82 | Ht 72.0 in | Wt 289.0 lb

## 2018-04-10 DIAGNOSIS — I10 Essential (primary) hypertension: Secondary | ICD-10-CM

## 2018-04-10 DIAGNOSIS — R000 Tachycardia, unspecified: Secondary | ICD-10-CM

## 2018-04-10 DIAGNOSIS — R002 Palpitations: Secondary | ICD-10-CM

## 2018-04-10 DIAGNOSIS — I471 Supraventricular tachycardia: Secondary | ICD-10-CM | POA: Diagnosis not present

## 2018-04-10 DIAGNOSIS — E785 Hyperlipidemia, unspecified: Secondary | ICD-10-CM

## 2018-04-10 NOTE — Progress Notes (Signed)
Cardiology Office Note:    Date:  04/10/2018   ID:  Philip Bruce, DOB 11-Jun-1971, MRN [id number]  PCP:  Karen Kays, NP  Cardiologist:  Charlton Haws, MD  Electrophysiologist:  None   Referring MD: Karen Kays, NP     History of Present Illness:    Philip Bruce is a 47 y.o. male with tachycardia here for follow-up of event monitor.  Event monitor demonstrated what appears to be brief atrial tachycardia for 19 beats with aberrancy.Philip Bruce  He felt some fluttering getting in and out of his vehicle. Felt dehydrated. Low back hurt. Bananas.   He has had a cath in 2016 no CAD.  He even saw Clydie Braun, EP.  EKG showed brief narrow complex rhythm with some aberrancy.  Felt to be atrial tachycardia with 2-1 conduction as very similar to current tracing.  No syncope, no Fevers. No CP. Left arm swing felt funny. EF 07/22/2015 was in normal range.    Past Medical History:  Diagnosis Date  . CKD (chronic kidney disease), stage II   . Dyslipidemia   . Hyperglycemia   . NICM (nonischemic cardiomyopathy) (HCC)    a. 07/2014: EF 45%, normal coronaries. ?Viral cardiomyopathy.    Past Surgical History:  Procedure Laterality Date  . LEFT HEART CATHETERIZATION WITH CORONARY ANGIOGRAM N/A 07/22/2014   Procedure: LEFT HEART CATHETERIZATION WITH CORONARY ANGIOGRAM;  Surgeon: Marykay Lex, MD;  Location: [facility] CATH LAB;  Service: Cardiovascular;  Laterality: N/A;    Current Medications: Current Meds  Medication Sig  . diltiazem (CARDIZEM) 30 MG tablet Take 1 tablet (30 mg total) by mouth daily as needed (palpitations).  Philip Bruce lisinopril (PRINIVIL,ZESTRIL) 5 MG tablet TAKE 1 TABLET BY MOUTH ONCE DAILY  . OVER THE COUNTER MEDICATION [employer] Mega Men Energy and Metabolism - Take two (2) capsules by mouth daily. Nugenix Ultimate Advanced Free Testosterone Complex - Take two (2) capsules by mouth twice daily.  . rosuvastatin (CRESTOR) 5 MG tablet TAKE 1 TABLET BY MOUTH ONCE DAILY AT  6  PM      Allergies:   Meloxicam; Statins; and Etodolac   Social History   Socioeconomic History  . Marital status: Married    Spouse name: Not on file  . Number of children: Not on file  . Years of education: Not on file  . Highest education level: Not on file  Occupational History  . Not on file  Social Needs  . Financial resource strain: Not on file  . Food insecurity:    Worry: Not on file    Inability: Not on file  . Transportation needs:    Medical: Not on file    Non-medical: Not on file  Tobacco Use  . Smoking status: Never Smoker  . Smokeless tobacco: Never Used  Substance and Sexual Activity  . Alcohol use: No  . Drug use: No  . Sexual activity: Yes  Lifestyle  . Physical activity:    Days per week: Not on file    Minutes per session: Not on file  . Stress: Not on file  Relationships  . Social connections:    Talks on phone: Not on file    Gets together: Not on file    Attends religious service: Not on file    Active member of club or organization: Not on file    Attends meetings of clubs or organizations: Not on file    Relationship status: Not on file  Other Topics Concern  . Not on  file  Social History Narrative  . Not on file     Family History: The patient's family history includes Cancer in his father; Coronary artery disease (age of onset: 48) in his mother; Diabetes in his mother.  ROS:   Please see the history of present illness.    No fevers chills nausea vomiting bleeding all other systems reviewed and are negative.  EKGs/Labs/Other Studies Reviewed:    The following studies were reviewed today: Prior office notes, echocardiogram, lab work  EKG:  EKG is not ordered today.  The ekg ordered today demonstrates prior EKG showed sinus rhythm with PACs  Recent Labs: No results found for requested labs within last 8760 hours.  Recent Lipid Panel    Component Value Date/Time   CHOL 163 03/31/2017 1102   TRIG 109 03/31/2017 1102   HDL 48  03/31/2017 1102   CHOLHDL 3.4 03/31/2017 1102   CHOLHDL 6.1 07/22/2014 0032   VLDL 68 (H) 07/22/2014 0032   LDLCALC 93 03/31/2017 1102    Physical Exam:    VS:  BP 110/76   Pulse 82   Ht 6' (1.829 m)   Wt 289 lb (131.1 kg)   SpO2 98%   BMI 39.20 kg/m     Wt Readings from Last 3 Encounters:  04/10/18 289 lb (131.1 kg)  04/04/18 283 lb (128.4 kg)  02/07/18 286 lb 8 oz (130 kg)     GEN:  Well nourished, well developed in no acute distress overweight HEENT: Normal NECK: No JVD; No carotid bruits LYMPHATICS: No lymphadenopathy CARDIAC: RRR, no murmurs, rubs, gallops RESPIRATORY:  Clear to auscultation without rales, wheezing or rhonchi  ABDOMEN: Soft, non-tender, non-distended MUSCULOSKELETAL:  No edema; No deformity  SKIN: Warm and dry NEUROLOGIC:  Alert and oriented x 3 PSYCHIATRIC:  Normal affect   ASSESSMENT:    1. Paroxysmal atrial tachycardia (HCC)   2. Tachycardia   3. Palpitations   4. Essential hypertension   5. Dyslipidemia    PLAN:    In order of problems listed above:  Paroxysmal atrial tachycardia - This event monitor recording looks very similar to the previously described atrial tachycardia with aberrant conduction.  Brief.  Felt some fluttering.  No syncope.  I do not believe that this was ventricular tachycardia.  Similar axis.  Continue with diltiazem.  Continue with hydration. - He does take Nugenix which has testofen a testosterone booster.  I do not see any stimulants otherwise.  Continue to exercise.  Trying to lose weight.  Try to get back into the gym. - If more worrisome symptoms develop, he will let us know.  Essential hypertension -Continue with both diltiazem as well as lisinopril.  Mixed hyperlipidemia -Continue with low-dose Crestor 5 mg once a day.   Medication Adjustments/Labs and Tests Ordered: Current medicines are reviewed at length with the patient today.  Concerns regarding medicines are outlined above.  No orders of the  defined types were placed in this encounter.  No orders of the defined types were placed in this encounter.   Patient Instructions  Medication Instructions:  The current medical regimen is effective;  continue present plan and medications.  Follow-Up: Follow up as previously scheduled.  Thank you for choosing [facility]!!         Signed, Donato Schultz, MD  04/10/2018 2:37 PM    [facility]

## 2018-04-10 NOTE — Patient Instructions (Signed)
Medication Instructions:  The current medical regimen is effective;  continue present plan and medications.  Follow-Up: Follow up as previously scheduled.  Thank you for choosing [facility]!!     

## 2018-04-19 ENCOUNTER — Telehealth: Payer: Self-pay | Admitting: Cardiology

## 2018-04-19 ENCOUNTER — Telehealth: Payer: Self-pay

## 2018-04-19 DIAGNOSIS — R002 Palpitations: Secondary | ICD-10-CM

## 2018-04-19 DIAGNOSIS — I471 Supraventricular tachycardia: Secondary | ICD-10-CM

## 2018-04-19 DIAGNOSIS — R000 Tachycardia, unspecified: Secondary | ICD-10-CM

## 2018-04-19 NOTE — Telephone Encounter (Signed)
Will forward to [facility] T who schedules for EP

## 2018-04-19 NOTE — Telephone Encounter (Signed)
Called pt re: his monitor from last night at 12:16am.. Pt reported that he activated his monitor because he got up from sleeping to get some water and felt his heart racing. He denies chest pain, sob, dizziness just his heart "racing"... Report called to Dr. Mayford Knife last night and she reported as an atrial tach... Showed to Dr. Delton See DOD today and she requests order to be put in for EP.[name] Pt agrees and will call if he has any problems prior to that appt. Pt to also keep appt with Dr. Eden Emms 11/19.

## 2018-04-19 NOTE — Telephone Encounter (Signed)
Called by [employer] due to patient having a 30 beat run of wide complex tachycardia.  Has has similar runs on heart monitor and according to Dr. Anne Fu note have been felt to be atrial tach vs. afib with abberation.  Reviewed rhythm strip which appears similar to other tele notifications.  Will forward to Dr. Eden Emms.  I spoke with patient who felt palpitations at the time but now is feeling fine.

## 2018-04-20 NOTE — Telephone Encounter (Signed)
Report received from event below. Given to medical records to be scanned to patient's chart. Patient has appointment with Dr. Elberta Fortis on 10/29.

## 2018-05-01 ENCOUNTER — Encounter: Payer: Self-pay | Admitting: Cardiology

## 2018-05-01 ENCOUNTER — Ambulatory Visit: Payer: Managed Care, Other (non HMO) | Admitting: Cardiology

## 2018-05-01 VITALS — BP 104/76 | HR 68 | Ht 72.0 in | Wt 287.6 lb

## 2018-05-01 DIAGNOSIS — I472 Ventricular tachycardia: Secondary | ICD-10-CM | POA: Diagnosis not present

## 2018-05-01 DIAGNOSIS — R000 Tachycardia, unspecified: Secondary | ICD-10-CM

## 2018-05-01 DIAGNOSIS — E785 Hyperlipidemia, unspecified: Secondary | ICD-10-CM | POA: Diagnosis not present

## 2018-05-01 NOTE — Progress Notes (Signed)
Electrophysiology Office Note   Date:  05/01/2018   ID:  Esaias Kromer, DOB 1970-10-21, MRN [id number]  PCP:  Patient, No Pcp Per  Cardiologist:  Eden Emms Primary Electrophysiologist:  Tyjai Matuszak Jorja Loa, MD    No chief complaint on file.    History of Present Illness: Philip Bruce is a 47 y.o. male who is being seen today for the evaluation of atrial tachycardia at the request of Donato Schultz. Presenting today for electrophysiology evaluation.  He has a history of nonischemic cardiomyopathy with recovery of his ejection fraction, CKD stage II, hyperlipidemia.  He had been having episodes of tachycardia and wore an event monitor that showed a brief episode of atrial tachycardia 19 beats with aberrancy.  He felt some fluttering getting in and out of his vehicle.  He was previously placed on diltiazem.  He has had palpitations for years.  His palpitations are worse if he has been working outside, sweating, and not drinking enough liquids.    Today, he denies symptoms of chest pain, shortness of breath, orthopnea, PND, lower extremity edema, claudication, dizziness, presyncope, syncope, bleeding, or neurologic sequela. The patient is tolerating medications without difficulties.  He has palpitations a few times a month.  He is currently wearing a cardiac monitor that shows episodes of a wide-complex tachycardia.  He has been called a few times while wearing the monitor is he was unaware of his tachycardia symptoms.  He feels that a lot of it is due to dehydration.  He says that he sits down and takes a few deep breaths and usually this goes away.  The longest episode lasted a few minutes.  They usually last less than 30 seconds.   Past Medical History:  Diagnosis Date  . CKD (chronic kidney disease), stage II   . Dyslipidemia   . Hyperglycemia   . NICM (nonischemic cardiomyopathy) (HCC)    a. 07/2014: EF 45%, normal coronaries. ?Viral cardiomyopathy.   Past Surgical History:  Procedure  Laterality Date  . LEFT HEART CATHETERIZATION WITH CORONARY ANGIOGRAM N/A 07/22/2014   Procedure: LEFT HEART CATHETERIZATION WITH CORONARY ANGIOGRAM;  Surgeon: Marykay Lex, MD;  Location: [facility] CATH LAB;  Service: Cardiovascular;  Laterality: N/A;     Current Outpatient Medications  Medication Sig Dispense Refill  . diltiazem (CARDIZEM) 30 MG tablet Take 1 tablet (30 mg total) by mouth daily as needed (palpitations). 30 tablet 3  . lisinopril (PRINIVIL,ZESTRIL) 5 MG tablet TAKE 1 TABLET BY MOUTH ONCE DAILY 90 tablet 3  . OVER THE COUNTER MEDICATION [employer] Mega Men Energy and Metabolism - Take two (2) capsules by mouth daily. Nugenix Ultimate Advanced Free Testosterone Complex - Take two (2) capsules by mouth twice daily.    . rosuvastatin (CRESTOR) 5 MG tablet TAKE 1 TABLET BY MOUTH ONCE DAILY AT  6  PM 90 tablet 3   No current facility-administered medications for this visit.     Allergies:   Meloxicam; Statins; and Etodolac   Social History:  The patient  reports that he has never smoked. He has never used smokeless tobacco. He reports that he does not drink alcohol or use drugs.   Family History:  The patient's family history includes Cancer in his father; Coronary artery disease (age of onset: 30) in his mother; Diabetes in his mother.    ROS:  Please see the history of present illness.   Otherwise, review of systems is positive for palpitations, muscle pain.   All other systems are reviewed and negative.  PHYSICAL EXAM: VS:  BP 104/76   Pulse 68   Ht 6' (1.829 m)   Wt 287 lb 9.6 oz (130.5 kg)   SpO2 98%   BMI 39.01 kg/m  , BMI Body mass index is 39.01 kg/m. GEN: Well nourished, well developed, in no acute distress  HEENT: normal  Neck: no JVD, carotid bruits, or masses Cardiac: RRR; no murmurs, rubs, or gallops,no edema  Respiratory:  clear to auscultation bilaterally, normal work of breathing GI: soft, nontender, nondistended, + BS MS: no deformity or atrophy  Skin:  warm and dry Neuro:  Strength and sensation are intact Psych: euthymic mood, full affect  EKG:  EKG is not ordered today. Personal review of the ekg ordered 04/04/18 shows sinus rhythm, rate 57  Recent Labs: No results found for requested labs within last 8760 hours.    Lipid Panel     Component Value Date/Time   CHOL 163 03/31/2017 1102   TRIG 109 03/31/2017 1102   HDL 48 03/31/2017 1102   CHOLHDL 3.4 03/31/2017 1102   CHOLHDL 6.1 07/22/2014 0032   VLDL 68 (H) 07/22/2014 0032   LDLCALC 93 03/31/2017 1102     Wt Readings from Last 3 Encounters:  05/01/18 287 lb 9.6 oz (130.5 kg)  04/10/18 289 lb (131.1 kg)  04/04/18 283 lb (128.4 kg)      Other studies Reviewed: Additional studies/ records that were reviewed today include: TTE 07/22/15  Review of the above records today demonstrates:  - Left ventricle: The cavity size was normal. Systolic function was   normal. The estimated ejection fraction was in the range of 55%   to 60%. Wall motion was normal; there were no regional wall   motion abnormalities. Left ventricular diastolic function   parameters were normal. - Left atrium: The atrium was mildly to moderately dilated. - Right ventricle: The cavity size was mildly dilated. Wall   thickness was normal.  Cardiac monitor 04/04/18 - personally reviewed Multiple episodes of wide-complex tachycardia  ASSESSMENT AND PLAN:  1.  Wide-complex tachycardia: This point is unclear as to the cause of his wide-complex tachycardia.  It appears that he has had an atrial tachycardia with aberrancy in the past.  In reviewing this current tachycardia, it appears that he has VA dissociation.  This would lead me to think that it is ventricular in origin.  He is minimally symptomatic from this and with his normal ejection fraction, it is okay to continue to watch this.  He would like to avoid medications.  Should he become more symptomatic, he would likely benefit from flecainide.  2.    Hyperlipidemia: Continue Crestor  3.  Nonischemic cardiomyopathy: Fraction is recovered.  Continue lisinopril.  Current medicines are reviewed at length with the patient today.   The patient does not have concerns regarding his medicines.  The following changes were made today:  none  Labs/ tests ordered today include:  No orders of the defined types were placed in this encounter.    Disposition:   FU with Missi Mcmackin 3 months  Signed, Pegi Milazzo Jorja Loa, MD  05/01/2018 2:58 PM     [facility] [address] [place] [address] [phone] (office) [phone] (fax)

## 2018-05-01 NOTE — Patient Instructions (Signed)
Medication Instructions:  Your physician recommends that you continue on your current medications as directed. Please refer to the Current Medication list given to you today.  If you need a refill on your cardiac medications before your next appointment, please call your pharmacy.   Lab work: None ordered  Testing/Procedures: None ordered  Follow-Up: At [facility], you and your health needs are our priority.  As part of our continuing mission to provide you with exceptional heart care, we have created designated Provider Care Teams.  These Care Teams include your primary Cardiologist (physician) and Advanced Practice Providers (APPs -  Physician Assistants and Nurse Practitioners) who all work together to provide you with the care you need, when you need it. You will need a follow up appointment in 3 months.  Please call our office 2 months in advance to schedule this appointment.  You may see Dr. Elberta Fortis or one of the following Advanced Practice Providers on your designated Care Team:   Gypsy Balsam, NP . Francis Dowse, PA-C  Thank you for choosing [facility]!!   Dory Horn, RN [phone]

## 2018-05-02 ENCOUNTER — Telehealth: Payer: Self-pay | Admitting: *Deleted

## 2018-05-02 NOTE — Telephone Encounter (Signed)
Received [employer] monitor report from 05/01/18 6:10 pm CST- report states sinus tachycardia w/ Run of V-Tach (6 beats)  Called patient.  He was not aware of this.  Did not sense.  Was at work.   Pt was seen by Dr. Estill Dooms yesterday. OV note and this monitor strip reviewed by Dr. Elberta Fortis.  Per Dr. Anne Fu   - could be a tach w/ abberency -could be WCT/VT Nonsustained.   Strips to be scanned in.

## 2018-05-17 NOTE — Progress Notes (Deleted)
Cardiology Office Note:    Date:  05/17/2018   ID:  Philip Bruce, DOB 10-31-1970, MRN [id number]  PCP:  Patient, No Pcp Per  Cardiologist:  Charlton Haws, MD  Electrophysiologist:  None   Referring MD: Karen Kays, NP     History of Present Illness:    Philip Bruce is a 47 y.o. male with tachycardia here for follow-up of event monitor.  Event monitor demonstrated what appears to be brief WCT with some VA dissociation. He has a history of atrial tachycardia with aberrancy but this appeared different. Seen by Dr Elberta Fortis on  05/01/18 and given normal EF no further w/u planned Last cath was in 2016 with no CAD.   No syncope, no Fevers. No CP. Left arm swing felt funny. EF 07/22/2015 was in normal range.  ***   Past Medical History:  Diagnosis Date  . CKD (chronic kidney disease), stage II   . Dyslipidemia   . Hyperglycemia   . NICM (nonischemic cardiomyopathy) (HCC)    a. 07/2014: EF 45%, normal coronaries. ?Viral cardiomyopathy.    Past Surgical History:  Procedure Laterality Date  . LEFT HEART CATHETERIZATION WITH CORONARY ANGIOGRAM N/A 07/22/2014   Procedure: LEFT HEART CATHETERIZATION WITH CORONARY ANGIOGRAM;  Surgeon: Marykay Lex, MD;  Location: [facility] CATH LAB;  Service: Cardiovascular;  Laterality: N/A;    Current Medications: No outpatient medications have been marked as taking for the 05/23/18 encounter (Appointment) with Wendall Stade, MD.     Allergies:   Meloxicam; Statins; and Etodolac   Social History   Socioeconomic History  . Marital status: Married    Spouse name: Not on file  . Number of children: Not on file  . Years of education: Not on file  . Highest education level: Not on file  Occupational History  . Not on file  Social Needs  . Financial resource strain: Not on file  . Food insecurity:    Worry: Not on file    Inability: Not on file  . Transportation needs:    Medical: Not on file    Non-medical: Not on file  Tobacco Use  .  Smoking status: Never Smoker  . Smokeless tobacco: Never Used  Substance and Sexual Activity  . Alcohol use: No  . Drug use: No  . Sexual activity: Yes  Lifestyle  . Physical activity:    Days per week: Not on file    Minutes per session: Not on file  . Stress: Not on file  Relationships  . Social connections:    Talks on phone: Not on file    Gets together: Not on file    Attends religious service: Not on file    Active member of club or organization: Not on file    Attends meetings of clubs or organizations: Not on file    Relationship status: Not on file  Other Topics Concern  . Not on file  Social History Narrative  . Not on file     Family History: The patient's family history includes Cancer in his father; Coronary artery disease (age of onset: 30) in his mother; Diabetes in his mother.  ROS:   Please see the history of present illness.    No fevers chills nausea vomiting bleeding all other systems reviewed and are negative.  EKGs/Labs/Other Studies Reviewed:    Notes EP Dr Elberta Fortis, TTE, Holter previous stress test 2016     EKG:  04/04/18  sinus rhythm with PACs  Recent Labs: No  results found for requested labs within last 8760 hours.  Recent Lipid Panel    Component Value Date/Time   CHOL 163 03/31/2017 1102   TRIG 109 03/31/2017 1102   HDL 48 03/31/2017 1102   CHOLHDL 3.4 03/31/2017 1102   CHOLHDL 6.1 07/22/2014 0032   VLDL 68 (H) 07/22/2014 0032   LDLCALC 93 03/31/2017 1102    Physical Exam:    VS:  There were no vitals taken for this visit.    Wt Readings from Last 3 Encounters:  05/01/18 287 lb 9.6 oz (130.5 kg)  04/10/18 289 lb (131.1 kg)  04/04/18 283 lb (128.4 kg)    Affect appropriate Obese black male  HEENT: normal Neck supple with no adenopathy JVP normal no bruits no thyromegaly Lungs clear with no wheezing and good diaphragmatic motion Heart:  S1/S2 no murmur, no rub, gallop or click PMI normal Abdomen: benighn, BS positve, no  tenderness, no AAA no bruit.  No HSM or HJR Distal pulses intact with no bruits No edema Neuro non-focal Skin warm and dry No muscular weakness   ASSESSMENT:    No diagnosis found. PLAN:    In order of problems listed above:  Paroxysmal atrial tachycardia - previously atrial with aberrancy now ? WCT with VA dissociation f/u with EP for most part asymptomatic  Continue cardizem.  Will update stress testing and do cardiac MRI to r/o substrate for VT in LV  Essential hypertension -Continue with both diltiazem as well as lisinopril.  Mixed hyperlipidemia -Continue with low-dose Crestor 5 mg once a day.  Lexiscan myovue Cardiac MRI BMET   Signed, Charlton Haws, MD  05/17/2018 1:34 PM    [facility]

## 2018-05-23 ENCOUNTER — Ambulatory Visit: Payer: Managed Care, Other (non HMO) | Admitting: Cardiovascular Disease

## 2018-06-14 NOTE — Progress Notes (Signed)
Cardiology Office Note:    Date:  06/15/2018   ID:  Philip Bruce, DOB Nov 29, 1970, MRN [id number]  PCP:  Daylene Katayama, PA  Cardiologist:  Charlton Haws, MD  Electrophysiologist:  None   Referring MD: Karen Kays, NP     History of Present Illness:    Philip Bruce is a 47 y.o. male with tachycardia here for follow-up of event monitor.  Event monitor demonstrated what appears to be brief WCT with some VA dissociation. 04/04/18  He has a history of atrial tachycardia with aberrancy but this appeared different. Seen by Dr Elberta Fortis on  05/01/18 and given normal EF no further w/u planned Last cath was in 2016 with no CAD. He indicated flecainide as option if arrhythmia more symptomatic   No syncope, no Fevers. No CP.    Still working at [employer] Has 47 yo graduating early wants to go to nursing school And a 45 yo as well     Past Medical History:  Diagnosis Date  . CKD (chronic kidney disease), stage II   . Dyslipidemia   . Hyperglycemia   . NICM (nonischemic cardiomyopathy) (HCC)    a. 07/2014: EF 45%, normal coronaries. ?Viral cardiomyopathy.    Past Surgical History:  Procedure Laterality Date  . LEFT HEART CATHETERIZATION WITH CORONARY ANGIOGRAM N/A 07/22/2014   Procedure: LEFT HEART CATHETERIZATION WITH CORONARY ANGIOGRAM;  Surgeon: Marykay Lex, MD;  Location: [facility] CATH LAB;  Service: Cardiovascular;  Laterality: N/A;    Current Medications: No outpatient medications have been marked as taking for the 06/15/18 encounter (Office Visit) with Wendall Stade, MD.     Allergies:   Meloxicam; Statins; and Etodolac   Social History   Socioeconomic History  . Marital status: Married    Spouse name: Not on file  . Number of children: Not on file  . Years of education: Not on file  . Highest education level: Not on file  Occupational History  . Not on file  Social Needs  . Financial resource strain: Not on file  . Food insecurity:    Worry: Not on file   Inability: Not on file  . Transportation needs:    Medical: Not on file    Non-medical: Not on file  Tobacco Use  . Smoking status: Never Smoker  . Smokeless tobacco: Never Used  Substance and Sexual Activity  . Alcohol use: No  . Drug use: No  . Sexual activity: Yes  Lifestyle  . Physical activity:    Days per week: Not on file    Minutes per session: Not on file  . Stress: Not on file  Relationships  . Social connections:    Talks on phone: Not on file    Gets together: Not on file    Attends religious service: Not on file    Active member of club or organization: Not on file    Attends meetings of clubs or organizations: Not on file    Relationship status: Not on file  Other Topics Concern  . Not on file  Social History Narrative  . Not on file     Family History: The patient's family history includes Cancer in his father; Coronary artery disease (age of onset: 69) in his mother; Diabetes in his mother.  ROS:   Please see the history of present illness.    No fevers chills nausea vomiting bleeding all other systems reviewed and are negative.  EKGs/Labs/Other Studies Reviewed:    Notes EP Dr Elberta Fortis,  TTE, Holter previous stress test 2016     EKG:  04/04/18  sinus rhythm with PACs  Recent Labs: No results found for requested labs within last 8760 hours.  Recent Lipid Panel    Component Value Date/Time   CHOL 163 03/31/2017 1102   TRIG 109 03/31/2017 1102   HDL 48 03/31/2017 1102   CHOLHDL 3.4 03/31/2017 1102   CHOLHDL 6.1 07/22/2014 0032   VLDL 68 (H) 07/22/2014 0032   LDLCALC 93 03/31/2017 1102    Physical Exam:    VS:  BP 124/82   Pulse 72   Ht 6' (1.829 m)   Wt 297 lb (134.7 kg)   BMI 40.28 kg/m     Wt Readings from Last 3 Encounters:  06/15/18 297 lb (134.7 kg)  05/01/18 287 lb 9.6 oz (130.5 kg)  04/10/18 289 lb (131.1 kg)    Affect appropriate Obese black male  HEENT: normal Neck supple with no adenopathy JVP normal no bruits no  thyromegaly Lungs clear with no wheezing and good diaphragmatic motion Heart:  S1/S2 no murmur, no rub, gallop or click PMI normal Abdomen: benighn, BS positve, no tenderness, no AAA no bruit.  No HSM or HJR Distal pulses intact with no bruits No edema Neuro non-focal Skin warm and dry No muscular weakness   ASSESSMENT:    1. Paroxysmal atrial tachycardia (HCC)   2. Hyperlipidemia, unspecified hyperlipidemia type   3. Essential hypertension    PLAN:    In order of problems listed above:  Paroxysmal atrial tachycardia - previously atrial with aberrancy now ? WCT with VA dissociation  F/u cardiac MRI to assess RV and LV function   Essential hypertension -Continue with both diltiazem as well as lisinopril.  Mixed hyperlipidemia -Continue with low-dose Crestor 5 mg once a day.   Signed, Charlton Haws, MD  06/15/2018 11:34 AM    [facility]

## 2018-06-15 ENCOUNTER — Encounter

## 2018-06-15 ENCOUNTER — Ambulatory Visit: Payer: Managed Care, Other (non HMO) | Admitting: Cardiovascular Disease

## 2018-06-15 VITALS — BP 124/82 | HR 72 | Ht 72.0 in | Wt 297.0 lb

## 2018-06-15 DIAGNOSIS — I471 Supraventricular tachycardia: Secondary | ICD-10-CM | POA: Diagnosis not present

## 2018-06-15 DIAGNOSIS — E785 Hyperlipidemia, unspecified: Secondary | ICD-10-CM

## 2018-06-15 DIAGNOSIS — I10 Essential (primary) hypertension: Secondary | ICD-10-CM | POA: Diagnosis not present

## 2018-06-15 NOTE — Patient Instructions (Addendum)
Medication Instructions:   If you need a refill on your cardiac medications before your next appointment, please call your pharmacy.   Lab work:  If you have labs (blood work) drawn today and your tests are completely normal, you will receive your results only by: [name] MyChart Message (if you have MyChart) OR . A paper copy in the mail If you have any lab test that is abnormal or we need to change your treatment, we will call you to review the results.  Testing/Procedures: NONE ordered today.  Follow-Up: At [facility], you and your health needs are our priority.  As part of our continuing mission to provide you with exceptional heart care, we have created designated Provider Care Teams.  These Care Teams include your primary Cardiologist (physician) and Advanced Practice Providers (APPs -  Physician Assistants and Nurse Practitioners) who all work together to provide you with the care you need, when you need it. You will need a follow up appointment in 12 months.  Please call our office 2 months in advance to schedule this appointment.  You may see Charlton Haws, MD or one of the following Advanced Practice Providers on your designated Care Team:   Norma Fredrickson, NP Nada Boozer, NP . Georgie Chard, NP Your physician wants you to follow-up in: 6 months with Dr. Elberta Fortis. You will receive a reminder letter in the mail two months in advance. If you don't receive a letter, please call our office to schedule the follow-up appointment.

## 2018-07-24 ENCOUNTER — Encounter: Payer: Self-pay | Admitting: Cardiology

## 2018-08-08 ENCOUNTER — Ambulatory Visit: Payer: 59 | Admitting: Cardiology

## 2018-08-08 ENCOUNTER — Encounter: Payer: Self-pay | Admitting: Cardiology

## 2018-08-08 ENCOUNTER — Encounter: Payer: Self-pay | Admitting: *Deleted

## 2018-08-08 ENCOUNTER — Encounter (INDEPENDENT_AMBULATORY_CARE_PROVIDER_SITE_OTHER): Payer: Self-pay

## 2018-08-08 VITALS — BP 126/72 | HR 88 | Ht 72.0 in | Wt 292.0 lb

## 2018-08-08 DIAGNOSIS — I472 Ventricular tachycardia: Secondary | ICD-10-CM

## 2018-08-08 DIAGNOSIS — R000 Tachycardia, unspecified: Secondary | ICD-10-CM

## 2018-08-08 NOTE — Progress Notes (Signed)
Electrophysiology Office Note   Date:  08/08/2018   ID:  Philip Bruce, Philip Bruce 02/18/1971, MRN [id number]  PCP:  Daylene Katayama, PA  Cardiologist:  Eden Emms Primary Electrophysiologist:  Jaydy Fitzhenry Jorja Loa, MD    No chief complaint on file.    History of Present Illness: Philip Bruce is a 48 y.o. male who is being seen today for the evaluation of atrial tachycardia at the request of Donato Schultz. Presenting today for electrophysiology evaluation.  He has a history of nonischemic cardiomyopathy with recovery of his ejection fraction, CKD stage II, hyperlipidemia.  He had been having episodes of tachycardia and wore an event monitor that showed a brief episode of atrial tachycardia 19 beats with aberrancy.  He felt some fluttering getting in and out of his vehicle.  He was previously placed on diltiazem.  He has had palpitations for years.  His palpitations are worse if he has been working outside, sweating, and not drinking enough liquids.  Today, denies symptoms of palpitations, chest pain, shortness of breath, orthopnea, PND, lower extremity edema, claudication, dizziness, presyncope, syncope, bleeding, or neurologic sequela. The patient is tolerating medications without difficulties.  Overall he is doing well.  He has no chest pain or shortness of breath.  He notes occasional palpitations, that feel like skipped beats.  No other complaints.   Past Medical History:  Diagnosis Date  . CKD (chronic kidney disease), stage II   . Dyslipidemia   . Hyperglycemia   . NICM (nonischemic cardiomyopathy) (HCC)    a. 07/2014: EF 45%, normal coronaries. ?Viral cardiomyopathy.   Past Surgical History:  Procedure Laterality Date  . LEFT HEART CATHETERIZATION WITH CORONARY ANGIOGRAM N/A 07/22/2014   Procedure: LEFT HEART CATHETERIZATION WITH CORONARY ANGIOGRAM;  Surgeon: Marykay Lex, MD;  Location[facility] C[facility];  Service: Cardiovascular;  Laterality: N/A;     Current Outpatient Medications    Medication Sig Dispense Refill  . diltiazem (CARDIZEM) 30 MG tablet Take 1 tablet (30 mg total) by mouth daily as needed (palpitations). 30 tablet 3  . lisinopril (PRINIVIL,ZESTRIL) 5 MG tablet TAKE 1 TABLET BY MOUTH ONCE DAILY 90 tablet 3  . OVER THE COUNTER MEDICATION [employer] Mega Men Energy and Metabolism - Take two (2) capsules by mouth daily. Nugenix Ultimate Advanced Free Testosterone Complex - Take two (2) capsules by mouth twice daily.    . rosuvastatin (CRESTOR) 5 MG tablet TAKE 1 TABLET BY MOUTH ONCE DAILY AT  6  PM 90 tablet 3   No current facility-administered medications for this visit.     Allergies:   Meloxicam; Statins; and Etodolac   Social History:  The patient  reports that he has never smoked. He has never used smokeless tobacco. He reports that he does not drink alcohol or use drugs.   Family History:  The patient's family history includes Cancer in his father; Coronary artery disease (age of onset: 85) in his mother; Diabetes in his mother.    ROS:  Please see the history of present illness.   Otherwise, review of systems is positive for palpitations, cough.   All other systems are reviewed and negative.   PHYSICAL EXAM: VS:  There were no vitals taken for this visit. , BMI There is no height or weight on file to calculate BMI. GEN: Well nourished, well developed, in no acute distress  HEENT: normal  Neck: no JVD, carotid bruits, or masses Cardiac: RRR; no murmurs, rubs, or gallops,no edema  Respiratory:  clear to auscultation bilaterally, normal  work of breathing GI: soft, nontender, nondistended, + BS MS: no deformity or atrophy  Skin: warm and dry Neuro:  Strength and sensation are intact Psych: euthymic mood, full affect  EKG:  EKG is ordered today. Personal review of the ekg ordered 04/04/18 shows SR, rate 57   Recent Labs: No results found for requested labs within last 8760 hours.    Lipid Panel     Component Value Date/Time   CHOL 163 03/31/2017  1102   TRIG 109 03/31/2017 1102   HDL 48 03/31/2017 1102   CHOLHDL 3.4 03/31/2017 1102   CHOLHDL 6.1 07/22/2014 0032   VLDL 68 (H) 07/22/2014 0032   LDLCALC 93 03/31/2017 1102     Wt Readings from Last 3 Encounters:  06/15/18 297 lb (134.7 kg)  05/01/18 287 lb 9.6 oz (130.5 kg)  04/10/18 289 lb (131.1 kg)      Other studies Reviewed: Additional studies/ records that were reviewed today include: TTE 07/22/15  Review of the above records today demonstrates:  - Left ventricle: The cavity size was normal. Systolic function was   normal. The estimated ejection fraction was in the range of 55%   to 60%. Wall motion was normal; there were no regional wall   motion abnormalities. Left ventricular diastolic function   parameters were normal. - Left atrium: The atrium was mildly to moderately dilated. - Right ventricle: The cavity size was mildly dilated. Wall   thickness was normal.  Cardiac monitor 04/04/18 - personally reviewed Multiple episodes of wide-complex tachycardia  ASSESSMENT AND PLAN:  1.  Wide-complex tachycardia: Not appear that he has had more symptomatic episodes.  He does have a few skipped beats that are likely PACs versus PVCs.  Nothing prolonged.  He has PRN diltiazem.  He would likely benefit from antiarrhythmic such as flecainide if he has further episodes.  2.   Hyperlipidemia: Continue Crestor per primary.  3.  Nonischemic cardiomyopathy: Continue lisinopril.  Ejection fraction has recovered.  Current medicines are reviewed at length with the patient today.   The patient does not have concerns regarding his medicines.  The following changes were made today: None  Labs/ tests ordered today include:  No orders of the defined types were placed in this encounter.    Disposition:   FU with Keshun Berrett 12 months  Signed, Daryan Buell Jorja Loa, MD  08/08/2018 8:26 AM     [facility] [address] [place] [address] [phone]  (office) [phone] (fax)

## 2018-08-08 NOTE — Patient Instructions (Signed)
Medication Instructions:  Your physician recommends that you continue on your current medications as directed. Please refer to the Current Medication list given to you today.  * If you need a refill on your cardiac medications before your next appointment, please call your pharmacy.   Labwork: None ordered  Testing/Procedures: None ordered  Follow-Up: Your physician wants you to follow-up in: 1 year with Dr. Camnitz.  You will receive a reminder letter in the mail two months in advance. If you don't receive a letter, please call our office to schedule the follow-up appointment.  Thank you for choosing [facility]!!   Masami Plata, RN [phone]        

## 2018-09-08 ENCOUNTER — Emergency Department (HOSPITAL_BASED_OUTPATIENT_CLINIC_OR_DEPARTMENT_OTHER): Payer: 59

## 2018-09-08 ENCOUNTER — Encounter (HOSPITAL_BASED_OUTPATIENT_CLINIC_OR_DEPARTMENT_OTHER): Payer: Self-pay | Admitting: *Deleted

## 2018-09-08 ENCOUNTER — Other Ambulatory Visit: Payer: Self-pay

## 2018-09-08 ENCOUNTER — Emergency Department (HOSPITAL_BASED_OUTPATIENT_CLINIC_OR_DEPARTMENT_OTHER)
Admission: EM | Admit: 2018-09-08 | Discharge: 2018-09-08 | Disposition: A | Discharge status: Home / Self Care | Payer: 59 | Attending: Emergency Medicine | Admitting: Emergency Medicine

## 2018-09-08 DIAGNOSIS — B9789 Other viral agents as the cause of diseases classified elsewhere: Secondary | ICD-10-CM | POA: Diagnosis not present

## 2018-09-08 DIAGNOSIS — Z79899 Other long term (current) drug therapy: Secondary | ICD-10-CM | POA: Diagnosis not present

## 2018-09-08 DIAGNOSIS — R05 Cough: Secondary | ICD-10-CM | POA: Diagnosis present

## 2018-09-08 DIAGNOSIS — N182 Chronic kidney disease, stage 2 (mild): Secondary | ICD-10-CM | POA: Insufficient documentation

## 2018-09-08 DIAGNOSIS — J069 Acute upper respiratory infection, unspecified: Secondary | ICD-10-CM | POA: Diagnosis not present

## 2018-09-08 MED ORDER — ACETAMINOPHEN-CODEINE 120-12 MG/5ML PO SOLN: 10.0000 mL | ORAL | 0 refills | Status: DC | PRN

## 2018-09-08 MED ORDER — GUAIFENESIN ER 1200 MG PO TB12: 1.0000 | ORAL_TABLET | Freq: Two times a day (BID) | ORAL | 0 refills | Status: DC

## 2018-09-08 MED ORDER — PREDNISONE 50 MG PO TABS: 50.0000 mg | ORAL_TABLET | Freq: Every day | ORAL | 0 refills | Status: DC

## 2018-09-08 NOTE — Discharge Instructions (Signed)
Return here as needed.  Your chest x-ray did not show any signs of pneumonia.  Increase your fluid intake and rest as much as possible.

## 2018-09-08 NOTE — ED Notes (Signed)
Pt c/o cough x 4 days, with chest congestion. Pt states he has bloody mucus. Pt denies fever, denies nasal congestion.

## 2018-09-08 NOTE — ED Triage Notes (Signed)
Pt complains of a cough that he has had X 4 days.

## 2018-09-10 NOTE — ED Provider Notes (Signed)
[facility] EMERGENCY DEPARTMENT Provider Note   CSN: [id number] Arrival date & time: 09/08/18  1720    History   Chief Complaint Chief Complaint  Patient presents with  . Cough    HPI Philip Bruce is a 48 y.o. male.     HPI Patient presents to the emergency department with 4-day history of cough congestion runny nose sore throat and some body aches.  Patient states that cough seems to have gotten worse.  Patient states he did not take any medications prior to arrival for symptoms.  The patient denies chest pain, shortness of breath, headache,blurred vision, neck pain, fever, weakness, numbness, dizziness, anorexia, edema, abdominal pain, nausea, vomiting, diarrhea, rash, back pain, dysuria, hematemesis, bloody stool, near syncope, or syncope. Past Medical History:  Diagnosis Date  . CKD (chronic kidney disease), stage II   . Dyslipidemia   . Hyperglycemia   . NICM (nonischemic cardiomyopathy) (HCC)    a. 07/2014: EF 45%, normal coronaries. ?Viral cardiomyopathy.    Patient Active Problem List   Diagnosis Date Noted  . Obesity (BMI 30-39.9) 03/21/2018  . Hyperglycemia 09/13/2016  . CKD (chronic kidney disease), stage II 09/13/2016  . Hypokalemia 08/07/2014  . Nonischemic cardiomyopathy (HCC) 07/23/2014  . Dyslipidemia 07/22/2014  . Chest pain 07/22/2014  . Disorder of bursae and tendons in shoulder region 02/04/2014  . Primary localized osteoarthrosis of shoulder region 02/04/2014    Past Surgical History:  Procedure Laterality Date  . LEFT HEART CATHETERIZATION WITH CORONARY ANGIOGRAM N/A 07/22/2014   Procedure: LEFT HEART CATHETERIZATION WITH CORONARY ANGIOGRAM;  Surgeon: Marykay Lex, MD;  Location: [facility] CATH LAB;  Service: Cardiovascular;  Laterality: N/A;        Home Medications    Prior to Admission medications   Medication Sig Start Date End Date Taking? Authorizing Provider  acetaminophen-codeine 120-12 MG/5ML solution Take 10 mLs by mouth  every 4 (four) hours as needed (cough). 09/08/18   Gloriann Riede, Cristal Deer, PA-C  diltiazem (CARDIZEM) 30 MG tablet Take 1 tablet (30 mg total) by mouth daily as needed (palpitations). 04/04/18   Tereso Newcomer T, PA-C  Guaifenesin 1200 MG TB12 Take 1 tablet (1,200 mg total) by mouth 2 (two) times daily. 09/08/18   Kionna Brier, Cristal Deer, PA-C  lisinopril (PRINIVIL,ZESTRIL) 5 MG tablet TAKE 1 TABLET BY MOUTH ONCE DAILY 03/13/18   Wendall Stade, MD  OVER THE COUNTER MEDICATION [employer] Mega Men Energy and Metabolism - Take two (2) capsules by mouth daily. Nugenix Ultimate Advanced Free Testosterone Complex - Take two (2) capsules by mouth twice daily.    [provider]  predniSONE (DELTASONE) 50 MG tablet Take 1 tablet (50 mg total) by mouth daily. 09/08/18   Burnice Vassel, Cristal Deer, PA-C  rosuvastatin (CRESTOR) 5 MG tablet TAKE 1 TABLET BY MOUTH ONCE DAILY AT  6  PM 03/13/18   Wendall Stade, MD    Family History Family History  Problem Relation Age of Onset  . Coronary artery disease Mother 1       MI  . Diabetes Mother   . Cancer Father     Social History Social History   Tobacco Use  . Smoking status: Never Smoker  . Smokeless tobacco: Never Used  Substance Use Topics  . Alcohol use: No  . Drug use: No     Allergies   Meloxicam; Statins; and Etodolac   Review of Systems Review of Systems All other systems negative except as documented in the HPI. All pertinent positives and negatives as  reviewed in the HPI.  Physical Exam Updated Vital Signs BP 121/74 (BP Location: Left Arm)   Pulse 88   Temp 99.3 F (37.4 C) (Oral)   Resp 18   Ht 6' (1.829 m)   Wt 130.2 kg   SpO2 98%   BMI 38.92 kg/m   Physical Exam Vitals signs and nursing note reviewed.  Constitutional:      General: He is not in acute distress.    Appearance: He is well-developed.  HENT:     Head: Normocephalic and atraumatic.  Eyes:     Pupils: Pupils are equal, round, and reactive to light.  Neck:      Musculoskeletal: Normal range of motion and neck supple.  Cardiovascular:     Rate and Rhythm: Normal rate and regular rhythm.     Heart sounds: Normal heart sounds. No murmur. No friction rub. No gallop.   Pulmonary:     Effort: Pulmonary effort is normal. No respiratory distress.     Breath sounds: Normal breath sounds. No wheezing or rhonchi.  Abdominal:     General: Bowel sounds are normal. There is no distension.     Palpations: Abdomen is soft.     Tenderness: There is no abdominal tenderness.  Skin:    General: Skin is warm and dry.     Capillary Refill: Capillary refill takes less than 2 seconds.     Findings: No erythema or rash.  Neurological:     Mental Status: He is alert and oriented to person, place, and time.     Motor: No abnormal muscle tone.     Coordination: Coordination normal.  Psychiatric:        Behavior: Behavior normal.      ED Treatments / Results  Labs (all labs ordered are listed, but only abnormal results are displayed) Labs Reviewed - No data to display  EKG None  Radiology No results found.  Procedures Procedures (including critical care time)  Medications Ordered in ED Medications - No data to display   Initial Impression / Assessment and Plan / ED Course  I have reviewed the triage vital signs and the nursing notes.  Pertinent labs & imaging results that were available during my care of the patient were reviewed by me and considered in my medical decision making (see chart for details).       Treated for viral URI with cough.  Have advised him to return here as needed.  Patient is advised to increase his fluid intake and rest as much as possible.  Patient's vital signs remained stable here in the emergency department.  Final Clinical Impressions(s) / ED Diagnoses   Final diagnoses:  Viral URI with cough    ED Discharge Orders         Ordered    Guaifenesin 1200 MG TB12  2 times daily,   Status:  Discontinued     09/08/18  1946    predniSONE (DELTASONE) 50 MG tablet  Daily,   Status:  Discontinued     09/08/18 1946    acetaminophen-codeine 120-12 MG/5ML solution  Every 4 hours PRN,   Status:  Discontinued     09/08/18 1946    acetaminophen-codeine 120-12 MG/5ML solution  Every 4 hours PRN     09/08/18 1947    Guaifenesin 1200 MG TB12  2 times daily     09/08/18 1947    predniSONE (DELTASONE) 50 MG tablet  Daily     09/08/18 1947  Charlestine Night, PA-C 09/10/18 2354    Vanetta Mulders, MD 09/11/18 [phone]

## 2019-04-03 ENCOUNTER — Other Ambulatory Visit: Payer: Self-pay | Admitting: Cardiovascular Disease

## 2019-05-17 NOTE — Progress Notes (Signed)
Cardiology Office Note:    Date:  05/22/2019   ID:  Troy Kanouse, DOB October 05, 1970, MRN [id number]  PCP:  Virginia Rochester, [place]  Cardiologist:  Jenkins Rouge, MD  Electrophysiologist:  Constance Haw, MD   Referring MD: Virginia Rochester, [place]     History of Present Illness:    Philip Bruce is a 48 y.o. male with tachycardia here for follow-up  Event monitor 04/04/18 demonstrated what appears to be brief WCT with some VA dissociation. 04/04/18  He has a history of atrial tachycardia with aberrancy but this appeared different. Seen by Dr Curt Bears on  05/01/18 and given normal EF no further w/u planned Last cath was in 2016 with no CAD. He indicated flecainide as option if arrhythmia more symptomatic   No syncope, no Fevers. No CP.    Still working at [employer] Has 48 yo graduating early wants to go to nursing school And a 48 yo as well  Needs f/u evaluation of EF either by MRI preferably or echo  Recently diagnosed with DM. A1c was over 10 Better on metformins He loves bread No cardiac complaints Lots of joint/back issues      Past Medical History:  Diagnosis Date  . CKD (chronic kidney disease), stage II   . Dyslipidemia   . Hyperglycemia   . NICM (nonischemic cardiomyopathy) ([place])    a. 07/2014: EF 45%, normal coronaries. ?Viral cardiomyopathy.    Past Surgical History:  Procedure Laterality Date  . LEFT HEART CATHETERIZATION WITH CORONARY ANGIOGRAM N/A 07/22/2014   Procedure: LEFT HEART CATHETERIZATION WITH CORONARY ANGIOGRAM;  Surgeon: Leonie Man, MD;  Location: [facility] CATH LAB;  Service: Cardiovascular;  Laterality: N/A;    Current Medications: Current Meds  Medication Sig  . acetaminophen-codeine 120-12 MG/5ML solution Take 10 mLs by mouth every 4 (four) hours as needed (cough).  [name] diltiazem (CARDIZEM) 30 MG tablet Take 1 tablet (30 mg total) by mouth daily as needed (palpitations).  . Guaifenesin 1200 MG TB12 Take 1 tablet (1,200 mg total) by mouth 2 (two) times  daily.  [name] lisinopril (ZESTRIL) 5 MG tablet Take 1 tablet by mouth once daily  . metFORMIN (GLUCOPHAGE-XR) 500 MG 24 hr tablet Take 500 mg by mouth daily.  [name] OVER THE COUNTER MEDICATION [employer] Mega Men Energy and Metabolism - Take two (2) capsules by mouth daily. Nugenix Ultimate Advanced Free Testosterone Complex - Take two (2) capsules by mouth twice daily.  . predniSONE (DELTASONE) 50 MG tablet Take 1 tablet (50 mg total) by mouth daily.  . rosuvastatin (CRESTOR) 5 MG tablet TAKE 1 TABLET BY MOUTH ONCE DAILY AT  6  PM     Allergies:   Meloxicam, Statins, and Etodolac   Social History   Socioeconomic History  . Marital status: Married    Spouse name: Not on file  . Number of children: Not on file  . Years of education: Not on file  . Highest education level: Not on file  Occupational History  . Not on file  Social Needs  . Financial resource strain: Not on file  . Food insecurity    Worry: Not on file    Inability: Not on file  . Transportation needs    Medical: Not on file    Non-medical: Not on file  Tobacco Use  . Smoking status: Never Smoker  . Smokeless tobacco: Never Used  Substance and Sexual Activity  . Alcohol use: No  . Drug use: No  . Sexual activity: Yes  Lifestyle  .  Physical activity    Days per week: Not on file    Minutes per session: Not on file  . Stress: Not on file  Relationships  . Social Musician on phone: Not on file    Gets together: Not on file    Attends religious service: Not on file    Active member of club or organization: Not on file    Attends meetings of clubs or organizations: Not on file    Relationship status: Not on file  Other Topics Concern  . Not on file  Social History Narrative  . Not on file     Family History: The patient's family history includes Cancer in his father; Coronary artery disease (age of onset: 58) in his mother; Diabetes in his mother.  ROS:   Please see the history of present illness.    No  fevers chills nausea vomiting bleeding all other systems reviewed and are negative.  EKGs/Labs/Other Studies Reviewed:    Notes EP Dr Elberta Fortis, TTE, Holter previous stress test 2016     EKG:  04/04/18  sinus rhythm with PACs 05/22/19 SR rate 66 Q 3,F   Recent Labs: No results found for requested labs within last 8760 hours.  Recent Lipid Panel    Component Value Date/Time   CHOL 163 03/31/2017 1102   TRIG 109 03/31/2017 1102   HDL 48 03/31/2017 1102   CHOLHDL 3.4 03/31/2017 1102   CHOLHDL 6.1 07/22/2014 0032   VLDL 68 (H) 07/22/2014 0032   LDLCALC 93 03/31/2017 1102    Physical Exam:    VS:  BP 106/72   Pulse 66   Ht 6' (1.829 m)   Wt 277 lb (125.6 kg)   SpO2 99%   BMI 37.57 kg/m     Wt Readings from Last 3 Encounters:  05/22/19 277 lb (125.6 kg)  09/08/18 287 lb (130.2 kg)  08/08/18 292 lb (132.5 kg)    Affect appropriate Obese black male  HEENT: normal Neck supple with no adenopathy JVP normal no bruits no thyromegaly Lungs clear with no wheezing and good diaphragmatic motion Heart:  S1/S2 no murmur, no rub, gallop or click PMI normal Abdomen: benighn, BS positve, no tenderness, no AAA no bruit.  No HSM or HJR Distal pulses intact with no bruits No edema Neuro non-focal Skin warm and dry No muscular weakness   ASSESSMENT:    No diagnosis found. PLAN:    In order of problems listed above:  Paroxysmal atrial tachycardia - previously atrial with aberrancy now ? WCT with VA dissociation  Was to have MRI not done Seen by Dr Elberta Fortis who did not add/change Rx 08/08/18 Suggested flecainide for any further  Symptomatic episodes Needs updated assessment of EF echo ordered   Essential hypertension -Continue with both diltiazem as well as lisinopril.  Mixed hyperlipidemia -Continue with low-dose Crestor 5 mg once a day. LDL was 110 suggested with new diagnosis of DM that target should be 70 and have primary increase dose to 10 mg daily and f/u labs     Signed, Charlton Haws, MD  05/22/2019 9:38 AM    [facility]

## 2019-05-22 ENCOUNTER — Encounter (INDEPENDENT_AMBULATORY_CARE_PROVIDER_SITE_OTHER): Payer: Self-pay

## 2019-05-22 ENCOUNTER — Ambulatory Visit: Payer: 59 | Admitting: Cardiovascular Disease

## 2019-05-22 ENCOUNTER — Other Ambulatory Visit: Payer: Self-pay

## 2019-05-22 ENCOUNTER — Encounter: Payer: Self-pay | Admitting: Cardiovascular Disease

## 2019-05-22 VITALS — BP 106/72 | HR 66 | Ht 72.0 in | Wt 277.0 lb

## 2019-05-22 DIAGNOSIS — I471 Supraventricular tachycardia: Secondary | ICD-10-CM | POA: Diagnosis not present

## 2019-05-22 DIAGNOSIS — I4719 Other supraventricular tachycardia: Secondary | ICD-10-CM

## 2019-05-22 NOTE — Patient Instructions (Signed)
Medication Instructions:   *If you need a refill on your cardiac medications before your next appointment, please call your pharmacy*  Lab Work:  If you have labs (blood work) drawn today and your tests are completely normal, you will receive your results only by: . MyChart Message (if you have MyChart) OR . A paper copy in the mail If you have any lab test that is abnormal or we need to change your treatment, we will call you to review the results.  Testing/Procedures: Your physician has requested that you have an echocardiogram. Echocardiography is a painless test that uses sound waves to create images of your heart. It provides your doctor with information about the size and shape of your heart and how well your heart's chambers and valves are working. This procedure takes approximately one hour. There are no restrictions for this procedure.  Follow-Up: At [facility], you and your health needs are our priority.  As part of our continuing mission to provide you with exceptional heart care, we have created designated Provider Care Teams.  These Care Teams include your primary Cardiologist (physician) and Advanced Practice Providers (APPs -  Physician Assistants and Nurse Practitioners) who all work together to provide you with the care you need, when you need it.  Your next appointment:   12 month(s)  The format for your next appointment:   In Person  Provider:   You may see Peter Nishan, MD or one of the following Advanced Practice Providers on your designated Care Team:    Lori Gerhardt, NP  Laura Ingold, NP  Jill McDaniel, NP   

## 2019-06-05 ENCOUNTER — Encounter (HOSPITAL_COMMUNITY): Payer: Self-pay | Admitting: Cardiovascular Disease

## 2019-06-05 ENCOUNTER — Other Ambulatory Visit (HOSPITAL_COMMUNITY): Payer: 59

## 2019-06-26 ENCOUNTER — Other Ambulatory Visit: Payer: Self-pay

## 2019-06-26 ENCOUNTER — Ambulatory Visit (HOSPITAL_COMMUNITY): Payer: 59 | Attending: Cardiology

## 2019-06-26 DIAGNOSIS — I471 Supraventricular tachycardia: Secondary | ICD-10-CM | POA: Diagnosis present

## 2019-07-01 ENCOUNTER — Other Ambulatory Visit: Payer: Self-pay

## 2019-07-01 ENCOUNTER — Other Ambulatory Visit: Payer: Self-pay | Admitting: Cardiovascular Disease

## 2019-07-01 MED ORDER — LISINOPRIL 5 MG PO TABS: 5.0000 mg | ORAL_TABLET | Freq: Every day | ORAL | 3 refills | Status: DC

## 2019-07-19 ENCOUNTER — Telehealth: Payer: Self-pay | Admitting: Cardiovascular Disease

## 2019-07-19 NOTE — Telephone Encounter (Signed)
Will consult Pharm D.

## 2019-07-19 NOTE — Telephone Encounter (Signed)
Pt c/o medication issue:  1. Name of Medication: Metamucil  2. How are you currently taking this medication (dosage and times per day)? n/a  3. Are you having a reaction (difficulty breathing--STAT)? No  4. What is your medication issue? Patient wants to know if it would be ok to take Metamucil with his current medications. He wants to make sure it does not interfere with them.

## 2019-07-22 NOTE — Telephone Encounter (Signed)
Yes that will be fine. 

## 2019-07-22 NOTE — Telephone Encounter (Signed)
Called patient with the Pharm D's recommendations. Patient verbalized understanding.

## 2019-09-24 NOTE — Progress Notes (Signed)
Cardiology Office Note Date:  09/25/2019  Patient ID:  Philip Bruce 09-16-1970, MRN [id number] PCP:  Philip Katayama, PA  Cardiologist:  Dr. Eden Emms Electrophysiologist: Dr. Elberta Fortis    Chief Complaint: annual EP visit  History of Present Illness: Philip Bruce is a 49 y.o. male with history of NICM with normalized EF, HLD, CKD (III), HTN, palpitations, Atach  He comes in today to be seen for Dr. Elberta Fortis, last seen by him Feb 2020.  Referred 2/2 c/o palpitations.  He had worn a monitor that showed a brief episode of atrial tachycardia 19 beats with aberrancy. He reported palpitations for years, worse in the heat after working outside. Dr. Elberta Fortis noted  Wide-complex tachycardia: Not appear that he has had more symptomatic episodes.  He does have a few skipped beats that are likely PACs versus PVCs.  Nothing prolonged.  He has PRN diltiazem.  He would likely benefit from antiarrhythmic such as flecainide if he has further episodes  Most recently saw Dr. Eden Emms Nov 2020.noted recently found diabetic.  Planned to update his echo/reassess his EF Recommended increasing his Crestor given diabetes, deferred to PMD for ongoing management/monitoring.  Echo noted LVEF preserved 55-60%, mild LVH, no WMA  He feels very well.  Can not recall the last time he had any palpitations.  Thinks he may have used the PRN diltiazem maybe twice, unknown last time.  His job is very labor intensive, denies any CP, SOB, DOE.  No dizzy spells, near syncope or syncope. He looks forward to warmer months and getting outside to walk, jog.  Does not like the treadmill, has not been to the gym with COVID  He has adjusted his eating habits, lost a couple pounds and with the metformin says his AIc is back <5    Past Medical History:  Diagnosis Date  . CKD (chronic kidney disease), stage II   . Dyslipidemia   . Hyperglycemia   . NICM (nonischemic cardiomyopathy) (HCC)    a. 07/2014: EF 45%, normal coronaries.  ?Viral cardiomyopathy.    Past Surgical History:  Procedure Laterality Date  . LEFT HEART CATHETERIZATION WITH CORONARY ANGIOGRAM N/A 07/22/2014   Procedure: LEFT HEART CATHETERIZATION WITH CORONARY ANGIOGRAM;  Surgeon: Marykay Lex, MD;  Location: [facility] CATH LAB;  Service: Cardiovascular;  Laterality: N/A;    Current Outpatient Medications  Medication Sig Dispense Refill  . diltiazem (CARDIZEM) 30 MG tablet Take 1 tablet (30 mg total) by mouth daily as needed (palpitations). 30 tablet 3  . lisinopril (ZESTRIL) 5 MG tablet Take 1 tablet (5 mg total) by mouth daily. 90 tablet 3  . metFORMIN (GLUCOPHAGE-XR) 500 MG 24 hr tablet Take 500 mg by mouth daily.    [name] OVER THE COUNTER MEDICATION [employer] Mega Men Energy and Metabolism - Take two (2) capsules by mouth daily. Nugenix Ultimate Advanced Free Testosterone Complex - Take two (2) capsules by mouth twice daily.    . rosuvastatin (CRESTOR) 5 MG tablet TAKE 1 TABLET BY MOUTH ONCE DAILY AT  6  PM 90 tablet 3   No current facility-administered medications for this visit.    Allergies:   Meloxicam, Statins, and Etodolac   Social History:  The patient  reports that he has never smoked. He has never used smokeless tobacco. He reports that he does not drink alcohol or use drugs.   Family History:  The patient's family history includes Cancer in his father; Coronary artery disease (age of onset: 52) in his mother; Diabetes in his  mother.  ROS:  Please see the history of present illness.  All other systems are reviewed and otherwise negative.   PHYSICAL EXAM:  VS:  BP 110/66   Pulse 83   Ht 6' (1.829 m)   Wt 274 lb (124.3 kg)   SpO2 96%   BMI 37.16 kg/m  BMI: Body mass index is 37.16 kg/m. Well nourished, well developed, in no acute distress  HEENT: normocephalic, atraumatic  Neck: no JVD, carotid bruits or masses Cardiac:  RRR; no significant murmurs, no rubs, or gallops Lungs:  CTA b/l, no wheezing, rhonchi or rales  Abd: soft, nontender,  obese MS: no deformity or atrophy Ext: no edema  Skin: warm and dry, no rash Neuro:  No gross deficits appreciated Psych: euthymic mood, full affect    EKG:  Not done today   06/26/2019: TTE IMPRESSIONS  1. Left ventricular ejection fraction, by visual estimation, is 55 to  60%. The left ventricle has low normal function. There is mildly increased  left ventricular hypertrophy.  2. Mildly dilated left ventricular internal cavity size.  3. The left ventricle has no regional wall motion abnormalities.  4. Global right ventricle has normal systolic function.The right  ventricular size is normal. No increase in right ventricular wall  thickness.  5. Left atrial size was normal.  6. Right atrial size was normal.  7. The mitral valve is normal in structure. No evidence of mitral valve  regurgitation. No evidence of mitral stenosis.  8. The tricuspid valve is normal in structure.  9. The aortic valve is tricuspid. Aortic valve regurgitation is not  visualized. No evidence of aortic valve sclerosis or stenosis.  10. The pulmonic valve was normal in structure. Pulmonic valve  regurgitation is trivial.  11. The inferior vena cava is normal in size with greater than 50%  respiratory variability, suggesting right atrial pressure of 3 mmHg.     10/202019 Monitor NSR rates 42-167 PVC;s Multiple runs of wide complex tachycardia Also some apparent Idioventricular rhythm with same morphology rates 94 Likely runs of NSVT ? Retrograde P waves seen Refer to EP for further evaluation      Recent Labs: No results found for requested labs within last 8760 hours.  No results found for requested labs within last 8760 hours.   CrCl cannot be calculated (Patient's most recent lab result is older than the maximum 21 days allowed.).   Wt Readings from Last 3 Encounters:  09/25/19 274 lb (124.3 kg)  05/22/19 277 lb (125.6 kg)  09/08/18 287 lb (130.2 kg)     Other studies  reviewed: Additional studies/records reviewed today include: summarized above  ASSESSMENT AND PLAN:  1. WCT 2. AT, PAC, PVC  Palpitations are not an ongoing complaint. Discussed keeping adequately hydrated given his line of work, and with the warmer months coming up  continue with Dr. Johnsie Cancel, EP service will see as needed    Disposition: as above  Current medicines are reviewed at length with the patient today.  The patient did not have any concerns regarding medicines.  Venetia Night, PA-C 09/25/2019 2:10 PM     [place] [address] [phone] (office)  [phone] (fax)

## 2019-09-25 ENCOUNTER — Ambulatory Visit: Payer: 59 | Admitting: Physician Assistant

## 2019-09-25 ENCOUNTER — Other Ambulatory Visit: Payer: Self-pay

## 2019-09-25 VITALS — BP 110/66 | HR 83 | Ht 72.0 in | Wt 274.0 lb

## 2019-09-25 DIAGNOSIS — R000 Tachycardia, unspecified: Secondary | ICD-10-CM

## 2019-09-25 DIAGNOSIS — I472 Ventricular tachycardia: Secondary | ICD-10-CM

## 2019-09-25 DIAGNOSIS — I471 Supraventricular tachycardia: Secondary | ICD-10-CM | POA: Diagnosis not present

## 2019-09-25 NOTE — Patient Instructions (Signed)
Medication Instructions:   Your physician recommends that you continue on your current medications as directed. Please refer to the Current Medication list given to you today.   *If you need a refill on your cardiac medications before your next appointment, please call your pharmacy*   Lab Work: NONE ORDERED  TODAY   If you have labs (blood work) drawn today and your tests are completely normal, you will receive your results only by: [name] MyChart Message (if you have MyChart) OR . A paper copy in the mail If you have any lab test that is abnormal or we need to change your treatment, we will call you to review the results.   Testing/Procedures: NONE ORDERED  TODAY   Follow-Up: At [facility], you and your health needs are our priority.  As part of our continuing mission to provide you with exceptional heart care, we have created designated Provider Care Teams.  These Care Teams include your primary Cardiologist (physician) and Advanced Practice Providers (APPs -  Physician Assistants and Nurse Practitioners) who all work together to provide you with the care you need, when you need it.  We recommend signing up for the patient portal called "MyChart".  Sign up information is provided on this After Visit Summary.  MyChart is used to connect with patients for Virtual Visits (Telemedicine).  Patients are able to view lab/test results, encounter notes, upcoming appointments, etc.  Non-urgent messages can be sent to your provider as well.   To learn more about what you can do with MyChart, go to [email or web address].    Your next appointment:    CONTACT [facility]  ELECTROPHYSIOLOGY DEPARTMENT [phone] AS NEEDED FOR  ANY CARDIAC RELATED SYMPTOMS  CONTINUE TO FOLLOW UP WITH DR Eden Emms  FOR PRIMARY CARDIOLOGY  The format for your next appointment:     Other Instructions

## 2020-05-09 IMAGING — CR DG CHEST 2V
2 series · 2 of 2 positions shown · non-contrast
Comparison: 04/03/2018

CLINICAL DATA: Cough for 4 days.

EXAM:
CHEST - 2 VIEW

[w chest pa]
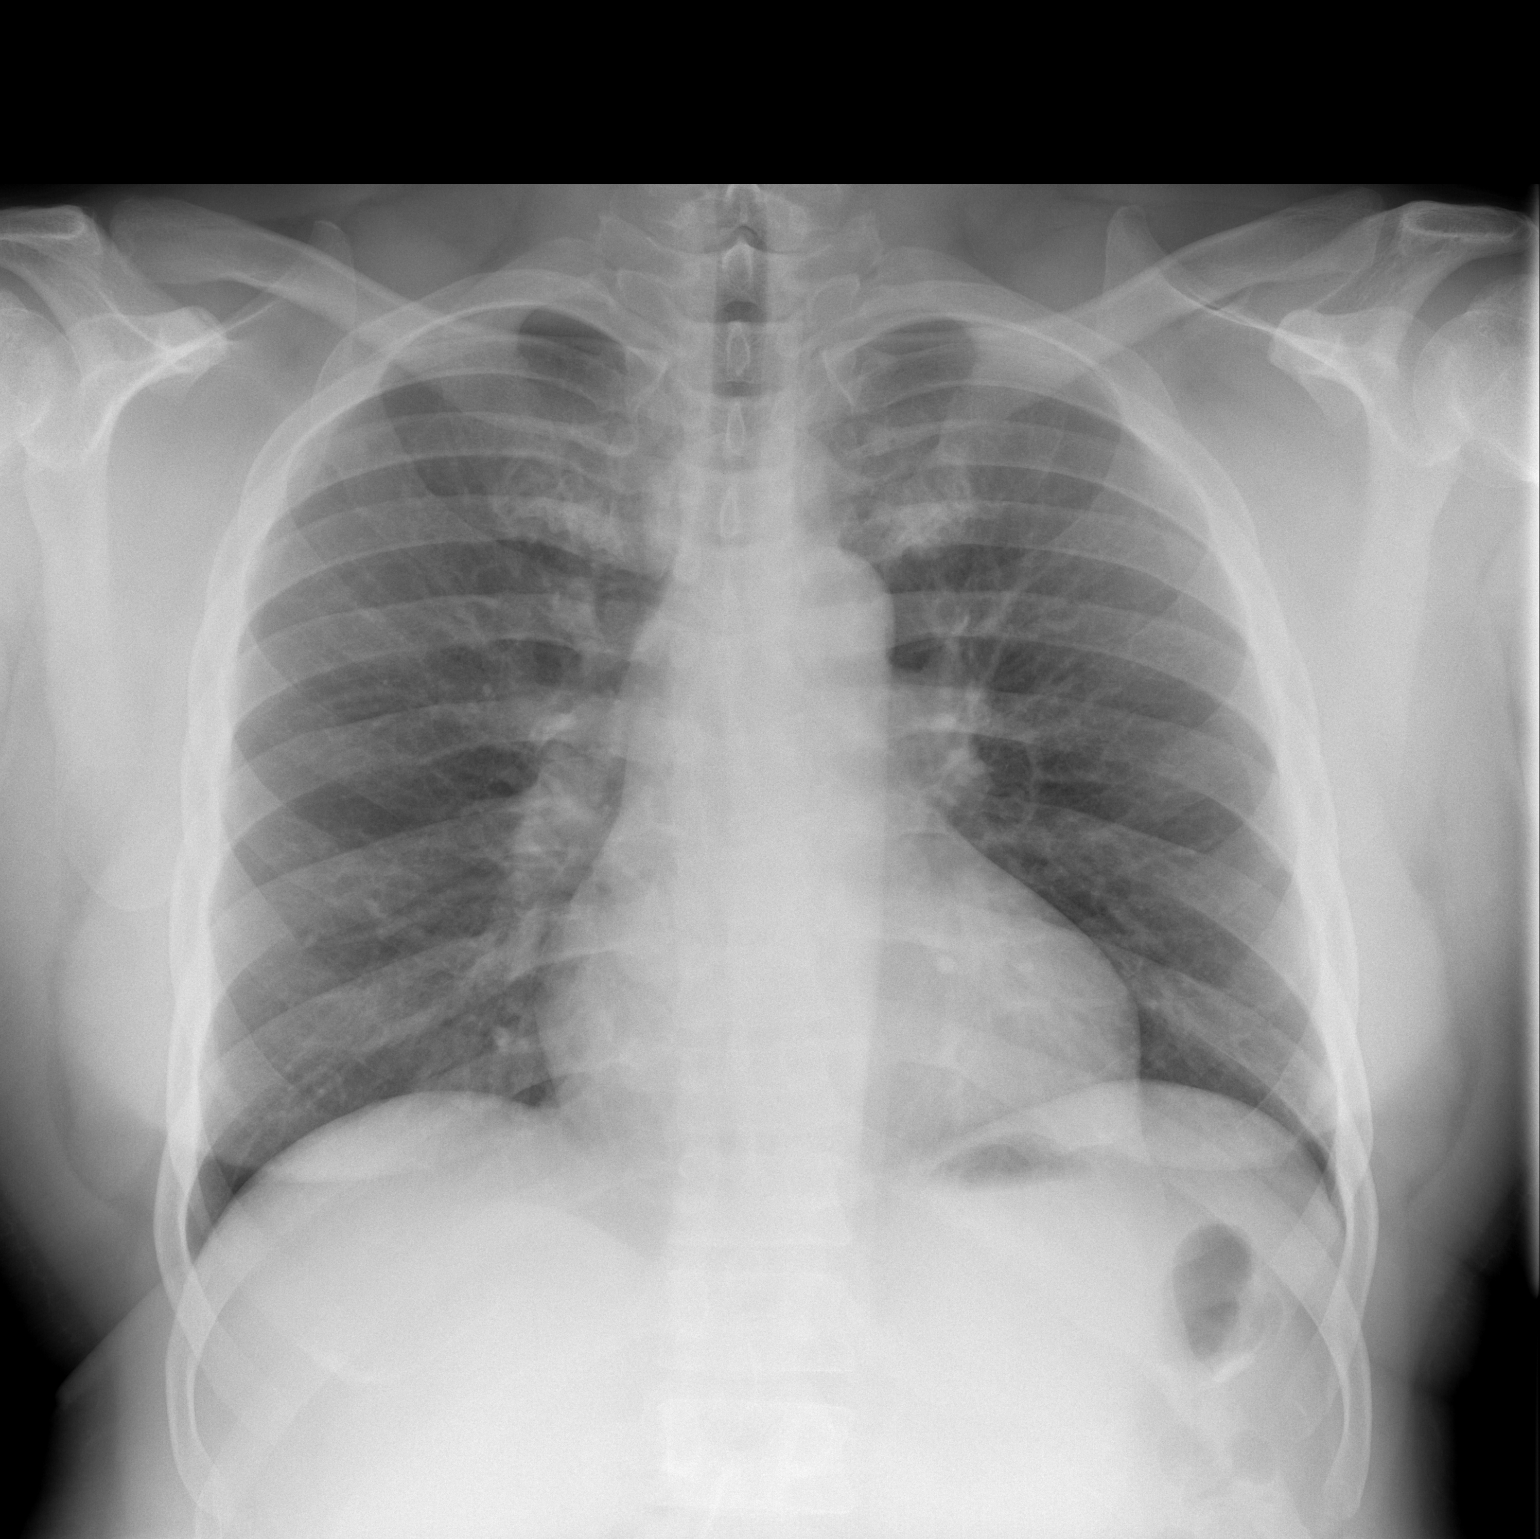

[w chest lat]
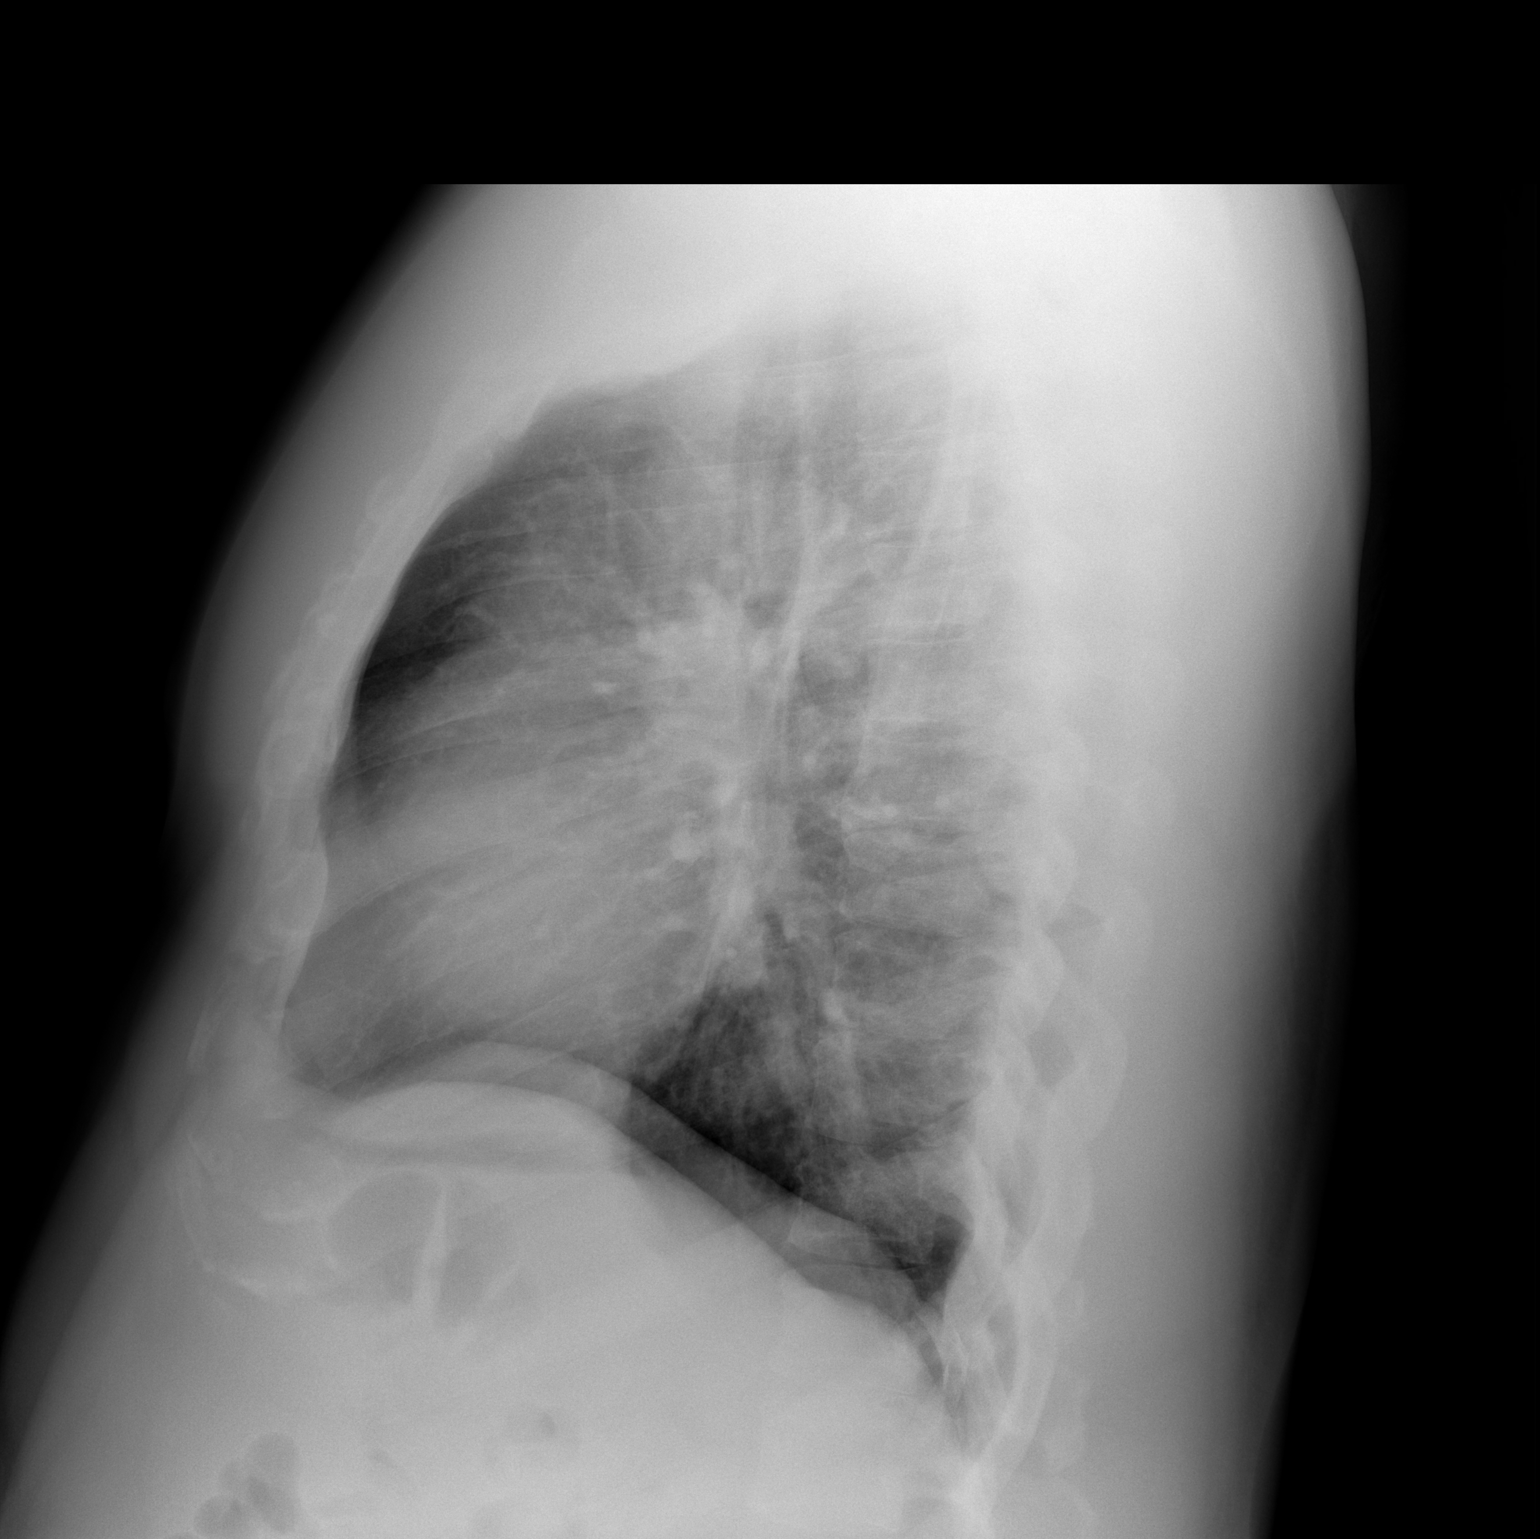

[2 of 2 positions shown; findings below may reference images not displayed]

FINDINGS: The cardiomediastinal contours are normal. The lungs are clear.
Pulmonary vasculature is normal. No consolidation, pleural effusion,
or pneumothorax. No acute osseous abnormalities are seen.
IMPRESSION: No acute chest finding.

## 2020-07-24 NOTE — Progress Notes (Signed)
Virtual Visit via Video Note   This visit type was conducted due to national recommendations for restrictions regarding the COVID-19 Pandemic (e.g. social distancing) in an effort to limit this patient's exposure and mitigate transmission in our community.  Due to her co-morbid illnesses, this patient is at least at moderate risk for complications without adequate follow up.  This format is felt to be most appropriate for this patient at this time.  All issues noted in this document were discussed and addressed.  A limited physical exam was performed with this format.  Please refer to the patient's chart for her consent to telehealth for Philip Bruce.    Date:  07/27/2020   ID:  Philip Bruce, DOB [birth year], MRN [id number]  PCP:  Philip Katayama, PA  Cardiologist:  Philip Haws, MD  Electrophysiologist:  Philip Lemming, MD   Referring MD: Philip Bruce, [place]     History of Present Illness:    Philip Bruce is a 50 y.o. male with tachycardia here for follow-up  Event monitor 04/04/18 demonstrated what appears to be brief WCT with some VA dissociation. 04/04/18  He has a history of atrial tachycardia with aberrancy but this appeared different. Seen by Dr Philip Bruce on  05/01/18 and given normal EF no further w/u planned Last cath was in 2016 with no CAD. He indicated flecainide as option if arrhythmia more symptomatic   No syncope, no Fevers. No CP.   TTE 06/26/19 EF 55-60% no significant valve disease   Still working at [employer] Has 48 yo graduating early wants to go to nursing school And a 48 yo as well    Lab review :  LDL 108.    Wife is in hospital with some sort of abdominal issue She is 3 months Post hysterectomy and has fluid in her abdomen   Recently diagnosed with DM. A1c was over 10 Better on metformins He loves bread No cardiac complaints Lots of joint/back issues   Past Medical History:  Diagnosis Date  . CKD (chronic kidney disease), stage II   . Dyslipidemia   .  Hyperglycemia   . NICM (nonischemic cardiomyopathy) (HCC)    a. 07/2014: EF 45%, normal coronaries. ?Viral cardiomyopathy.    Past Surgical History:  Procedure Laterality Date  . LEFT HEART CATHETERIZATION WITH CORONARY ANGIOGRAM N/A 07/22/2014   Procedure: LEFT HEART CATHETERIZATION WITH CORONARY ANGIOGRAM;  Surgeon: Philip Lex, MD;  Location: [facility] C[facility] LAB;  Service: Cardiovascular;  Laterality: N/A;    Current Medications: Current Meds  Medication Sig  . aspirin 81 MG EC tablet Take 81 mg by mouth daily.  [name] diltiazem (CARDIZEM) 30 MG tablet Take 1 tablet (30 mg total) by mouth daily as needed (palpitations).  [name] lisinopril (ZESTRIL) 5 MG tablet Take 1 tablet (5 mg total) by mouth daily.  . metFORMIN (GLUCOPHAGE-XR) 500 MG 24 hr tablet Take 500 mg by mouth daily.  [name] OVER THE COUNTER MEDICATION [employer] Mega Men Energy and Metabolism - Take two (2) capsules by mouth daily. Nugenix Ultimate Advanced Free Testosterone Complex - Take two (2) capsules by mouth twice daily.  . rosuvastatin (CRESTOR) 10 MG tablet Take 10 mg by mouth daily.     Allergies:   Meloxicam, Statins, and Etodolac   Social History   Socioeconomic History  . Marital status: Married    Spouse name: Not on file  . Number of children: Not on file  . Years of education: Not on file  . Highest education level: Not on  file  Occupational History  . Not on file  Tobacco Use  . Smoking status: Never Smoker  . Smokeless tobacco: Never Used  Vaping Use  . Vaping Use: Unknown  Substance and Sexual Activity  . Alcohol use: No  . Drug use: No  . Sexual activity: Yes  Other Topics Concern  . Not on file  Social History Narrative  . Not on file   Social Determinants of Health   Financial Resource Strain: Not on file  Food Insecurity: Not on file  Transportation Needs: Not on file  Physical Activity: Not on file  Stress: Not on file  Social Connections: Not on file     Family History: The patient's family  history includes Cancer in his father; Coronary artery disease (age of onset: 92) in his mother; Diabetes in his mother.  ROS:   Please see the history of present illness.    No fevers chills nausea vomiting bleeding all other systems reviewed and are negative.  EKGs/Labs/Other Studies Reviewed:    Notes EP Dr Philip Bruce, TTE, Holter previous stress test 2016     EKG:  04/04/18  sinus rhythm with PACs 05/22/19 SR rate 66 Q 3,F   Recent Labs: No results found for requested labs within last 8760 hours.  Recent Lipid Panel    Component Value Date/Time   CHOL 163 03/31/2017 1102   TRIG 109 03/31/2017 1102   HDL 48 03/31/2017 1102   CHOLHDL 3.4 03/31/2017 1102   CHOLHDL 6.1 07/22/2014 0032   VLDL 68 (H) 07/22/2014 0032   LDLCALC 93 03/31/2017 1102    Physical Exam:    VS:  Ht 6' (1.829 m)   Wt 123.4 kg   BMI 36.89 kg/m     Wt Readings from Last 3 Encounters:  07/27/20 123.4 kg  09/25/19 124.3 kg  05/22/19 125.6 kg    No distress No tachypnea No edema No JVP elevation    ASSESSMENT:    No diagnosis found. PLAN:    In order of problems listed above:  Paroxysmal atrial tachycardia - asymptomatic now TTE normal RV/LV function seen by EP no further w/u or Rx indicated   Essential hypertension -Continue with both diltiazem as well as lisinopril.  Mixed hyperlipidemia -Continue with low-dose Crestor 5 mg once a day. LDL was 93 with high triglycerides from poorly controlled DM  DM:  - Discussed low carb diet.  Target hemoglobin A1c is 6.5 or less.  Continue current medications.    Signed, Philip Haws, MD  07/27/2020 8:22 AM    [facility]

## 2020-07-27 ENCOUNTER — Telehealth (INDEPENDENT_AMBULATORY_CARE_PROVIDER_SITE_OTHER): Payer: 59 | Admitting: Cardiovascular Disease

## 2020-07-27 ENCOUNTER — Telehealth: Payer: Self-pay

## 2020-07-27 ENCOUNTER — Encounter: Payer: Self-pay | Admitting: Cardiovascular Disease

## 2020-07-27 ENCOUNTER — Other Ambulatory Visit: Payer: Self-pay

## 2020-07-27 VITALS — Ht 72.0 in | Wt 272.0 lb

## 2020-07-27 DIAGNOSIS — I471 Supraventricular tachycardia: Secondary | ICD-10-CM

## 2020-07-27 DIAGNOSIS — I10 Essential (primary) hypertension: Secondary | ICD-10-CM | POA: Diagnosis not present

## 2020-07-27 DIAGNOSIS — E782 Mixed hyperlipidemia: Secondary | ICD-10-CM

## 2020-07-27 NOTE — Patient Instructions (Signed)
Medication Instructions:  Your physician recommends that you continue on your current medications as directed. Please refer to the Current Medication list given to you today.  *If you need a refill on your cardiac medications before your next appointment, please call your pharmacy*   Lab Work: None If you have labs (blood work) drawn today and your tests are completely normal, you will receive your results only by: . MyChart Message (if you have MyChart) OR . A paper copy in the mail If you have any lab test that is abnormal or we need to change your treatment, we will call you to review the results.  Follow-Up: At [facility], you and your health needs are our priority.  As part of our continuing mission to provide you with exceptional heart care, we have created designated Provider Care Teams.  These Care Teams include your primary Cardiologist (physician) and Advanced Practice Providers (APPs -  Physician Assistants and Nurse Practitioners) who all work together to provide you with the care you need, when you need it.  Your next appointment:   6 month(s)  The format for your next appointment:   In Person  Provider:   You may see Peter Nishan, MD or one of the following Advanced Practice Providers on your designated Care Team:    Laura Ingold, NP  Jill McDaniel, NP   

## 2020-07-27 NOTE — Telephone Encounter (Signed)
  Patient Consent for Virtual Visit         Philip Bruce has provided verbal consent on 07/27/2020 for a virtual visit (video or telephone).   CONSENT FOR VIRTUAL VISIT FOR:  Philip Bruce  By participating in this virtual visit I agree to the following:  I hereby voluntarily request, consent and authorize [facility] and its employed or contracted physicians, Producer, television/film/video, nurse practitioners or other licensed health care professionals (the Practitioner), to provide me with telemedicine health care services (the "Services") as deemed necessary by the treating Practitioner. I acknowledge and consent to receive the Services by the Practitioner via telemedicine. I understand that the telemedicine visit will involve communicating with the Practitioner through live audiovisual communication technology and the disclosure of certain medical information by electronic transmission. I acknowledge that I have been given the opportunity to request an in-person assessment or other available alternative prior to the telemedicine visit and am voluntarily participating in the telemedicine visit.  I understand that I have the right to withhold or withdraw my consent to the use of telemedicine in the course of my care at any time, without affecting my right to future care or treatment, and that the Practitioner or I may terminate the telemedicine visit at any time. I understand that I have the right to inspect all information obtained and/or recorded in the course of the telemedicine visit and may receive copies of available information for a reasonable fee.  I understand that some of the potential risks of receiving the Services via telemedicine include:  [name] Delay or interruption in medical evaluation due to technological equipment failure or disruption; . Information transmitted may not be sufficient (e.g. poor resolution of images) to allow for appropriate medical decision making by the Practitioner;  and/or  . In rare instances, security protocols could fail, causing a breach of personal health information.  Furthermore, I acknowledge that it is my responsibility to provide information about my medical history, conditions and care that is complete and accurate to the best of my ability. I acknowledge that Practitioner's advice, recommendations, and/or decision may be based on factors not within their control, such as incomplete or inaccurate data provided by me or distortions of diagnostic images or specimens that may result from electronic transmissions. I understand that the practice of medicine is not an exact science and that Practitioner makes no warranties or guarantees regarding treatment outcomes. I acknowledge that a copy of this consent can be made available to me via my patient portal [facility] MyChart), or I can request a printed copy by calling the office of [facility].    I understand that my insurance will be billed for this visit.   I have read or had this consent read to me. . I understand the contents of this consent, which adequately explains the benefits and risks of the Services being provided via telemedicine.  . I have been provided ample opportunity to ask questions regarding this consent and the Services and have had my questions answered to my satisfaction. . I give my informed consent for the services to be provided through the use of telemedicine in my medical care

## 2021-01-18 ENCOUNTER — Telehealth: Payer: Self-pay | Admitting: Cardiovascular Disease

## 2021-01-18 NOTE — Telephone Encounter (Signed)
Pt c/o medication issue:  1. Name of Medication: Osteo Bi-Flex  2. How are you currently taking this medication (dosage and times per day)? Not currently taking   3. Are you having a reaction (difficulty breathing--STAT)? No   4. What is your medication issue? Augie is calling wanting to know if it is okay for him to take this supplement with the medications he is currently on. Please advise.

## 2021-01-19 NOTE — Telephone Encounter (Signed)
Ok to take [employer]

## 2021-01-19 NOTE — Telephone Encounter (Signed)
Will forward to PharmD for advisement. 

## 2021-01-22 NOTE — Telephone Encounter (Signed)
Called patient back to let him know that per PharmD that it's okay to take Osteo Bi-FLex. Patient verbalized understanding.

## 2021-03-31 ENCOUNTER — Telehealth: Payer: Self-pay | Admitting: Cardiovascular Disease

## 2021-03-31 MED ORDER — ROSUVASTATIN CALCIUM 10 MG PO TABS: 10.0000 mg | ORAL_TABLET | Freq: Every day | ORAL | 0 refills | Status: DC

## 2021-03-31 NOTE — Telephone Encounter (Signed)
Pt's medication was sent to pt's pharmacy as requested. Confirmation received.  °

## 2021-03-31 NOTE — Telephone Encounter (Signed)
He can take a probiotic, no issues with his other meds.

## 2021-03-31 NOTE — Telephone Encounter (Signed)
Called patient with pharmacist advisement. Patient verbalized understanding.

## 2021-03-31 NOTE — Telephone Encounter (Signed)
 *  STAT* If patient is at the pharmacy, call can be transferred to refill team.   1. Which medications need to be refilled? (please list name of each medication and dose if known) lisinopril (ZESTRIL) 5 MG tablet rosuvastatin (CRESTOR) 10 MG tablet  2. Which pharmacy/location (including street and city if local pharmacy) is medication to be sent to? [id number] - [place], [place] - [address]  3. Do they need a 30 day or 90 day supply? 90 days

## 2021-03-31 NOTE — Telephone Encounter (Signed)
  Pt c/o medication issue:  1. Name of Medication: probiotic   2. How are you currently taking this medication (dosage and times per day)?   3. Are you having a reaction (difficulty breathing--STAT)?   4. What is your medication issue? Pt wants to know if he can take probiotics and will not interfere with his heart meds

## 2021-03-31 NOTE — Telephone Encounter (Signed)
Called patient about message. Patient wants to start a probiotic, but wants to make sure it is okay to take with his medications. Informed patient that it should be safe, but would check with our pharmacist to double check. Will forward to Pharm D for advisement.

## 2021-04-05 ENCOUNTER — Other Ambulatory Visit: Payer: Self-pay

## 2021-04-05 ENCOUNTER — Emergency Department (HOSPITAL_BASED_OUTPATIENT_CLINIC_OR_DEPARTMENT_OTHER)
Admission: EM | Admit: 2021-04-05 | Discharge: 2021-04-06 | Disposition: A | Discharge status: Home / Self Care | Payer: 59 | Attending: Emergency Medicine | Admitting: Emergency Medicine

## 2021-04-05 DIAGNOSIS — E1122 Type 2 diabetes mellitus with diabetic chronic kidney disease: Secondary | ICD-10-CM | POA: Insufficient documentation

## 2021-04-05 DIAGNOSIS — M545 Low back pain, unspecified: Secondary | ICD-10-CM | POA: Insufficient documentation

## 2021-04-05 DIAGNOSIS — Z79899 Other long term (current) drug therapy: Secondary | ICD-10-CM | POA: Insufficient documentation

## 2021-04-05 DIAGNOSIS — N182 Chronic kidney disease, stage 2 (mild): Secondary | ICD-10-CM | POA: Insufficient documentation

## 2021-04-05 DIAGNOSIS — Z955 Presence of coronary angioplasty implant and graft: Secondary | ICD-10-CM | POA: Diagnosis not present

## 2021-04-05 DIAGNOSIS — R531 Weakness: Secondary | ICD-10-CM | POA: Diagnosis not present

## 2021-04-05 DIAGNOSIS — R519 Headache, unspecified: Secondary | ICD-10-CM

## 2021-04-05 DIAGNOSIS — Z7982 Long term (current) use of aspirin: Secondary | ICD-10-CM | POA: Insufficient documentation

## 2021-04-05 DIAGNOSIS — I129 Hypertensive chronic kidney disease with stage 1 through stage 4 chronic kidney disease, or unspecified chronic kidney disease: Secondary | ICD-10-CM | POA: Insufficient documentation

## 2021-04-05 LAB — URINALYSIS, ROUTINE W REFLEX MICROSCOPIC
Bilirubin Urine: NEGATIVE
Glucose, UA: NEGATIVE mg/dL
Hgb urine dipstick: NEGATIVE
Ketones, ur: NEGATIVE mg/dL
Leukocytes,Ua: NEGATIVE
Nitrite: NEGATIVE
Protein, ur: NEGATIVE mg/dL
Specific Gravity, Urine: 1.01 (ref 1.005–1.030)
pH: 6 (ref 5.0–8.0)

## 2021-04-05 MED ORDER — SODIUM CHLORIDE 0.9 % IV BOLUS
1000.0000 mL | Freq: Once | INTRAVENOUS | Status: AC
  Administered 2021-04-06: 1000 mL via INTRAVENOUS

## 2021-04-05 NOTE — ED Provider Notes (Addendum)
[facility] [place] EMERGENCY DEPARTMENT Provider Note   CSN: [id number] Arrival date & time: 04/05/21  1947     History Chief Complaint  Patient presents with   Flank Pain    Philip Bruce is a 50 y.o. male.  Patient is a 50 year old male with past medical history of type 2 diabetes, nonischemic cardiomyopathy, hyperlipidemia.  Patient presenting today with complaints of elevated blood pressure, low back pain, headache, and feeling weak.  He describes himself as feeling "out of it".  He denies any fevers or chills.  He denies any shortness of breath.  He denies any urinary complaints.  He went to the [facility] and checked his blood pressure.  He reports getting high readings he tells me as high as 150 systolic.  The history is provided by the patient.      Past Medical History:  Diagnosis Date   CKD (chronic kidney disease), stage II    Dyslipidemia    Hyperglycemia    NICM (nonischemic cardiomyopathy) (HCC)    a. 07/2014: EF 45%, normal coronaries. ?Viral cardiomyopathy.    Patient Active Problem List   Diagnosis Date Noted   Obesity (BMI 30-39.9) 03/21/2018   Hyperglycemia 09/13/2016   CKD (chronic kidney disease), stage II 09/13/2016   Hypokalemia 08/07/2014   Nonischemic cardiomyopathy (HCC) 07/23/2014   Dyslipidemia 07/22/2014   Chest pain 07/22/2014   Disorder of bursae and tendons in shoulder region 02/04/2014   Primary localized osteoarthrosis of shoulder region 02/04/2014    Past Surgical History:  Procedure Laterality Date   LEFT HEART CATHETERIZATION WITH CORONARY ANGIOGRAM N/A 07/22/2014   Procedure: LEFT HEART CATHETERIZATION WITH CORONARY ANGIOGRAM;  Surgeon: Marykay Lex, MD;  Location: [facility] CATH LAB;  Service: Cardiovascular;  Laterality: N/A;       Family History  Problem Relation Age of Onset   Coronary artery disease Mother 27       MI   Diabetes Mother    Cancer Father     Social History   Tobacco Use   Smoking status: Never    Smokeless tobacco: Never  Vaping Use   Vaping Use: Unknown  Substance Use Topics   Alcohol use: No   Drug use: No    Home Medications Prior to Admission medications   Medication Sig Start Date End Date Taking? Authorizing Provider  aspirin 81 MG EC tablet Take 81 mg by mouth daily. 07/23/20   [provider]  diltiazem (CARDIZEM) 30 MG tablet Take 1 tablet (30 mg total) by mouth daily as needed (palpitations). 04/04/18   Tereso Newcomer T, PA-C  lisinopril (ZESTRIL) 5 MG tablet Take 1 tablet (5 mg total) by mouth daily. 07/01/19   Wendall Stade, MD  metFORMIN (GLUCOPHAGE-XR) 500 MG 24 hr tablet Take 500 mg by mouth daily. 03/20/19   [provider]  OVER THE COUNTER MEDICATION [employer] Mega Men Energy and Metabolism - Take two (2) capsules by mouth daily. Nugenix Ultimate Advanced Free Testosterone Complex - Take two (2) capsules by mouth twice daily.    [provider]  rosuvastatin (CRESTOR) 10 MG tablet Take 1 tablet (10 mg total) by mouth daily. Please keep upcoming appt in December 2022 with Dr. Eden Emms before anymore refills. Thank you 03/31/21   Wendall Stade, MD    Allergies    Meloxicam, Statins, and Etodolac  Review of Systems   Review of Systems  All other systems reviewed and are negative.  Physical Exam Updated Vital Signs BP 125/90 (BP Location:  Right Arm)   Pulse 74   Temp 98.6 F (37 C) (Oral)   Resp 18   Ht 6' (1.829 m)   Wt 128.4 kg   SpO2 100%   BMI 38.38 kg/m   Physical Exam Vitals and nursing note reviewed.  Constitutional:      General: He is not in acute distress.    Appearance: He is well-developed. He is not diaphoretic.  HENT:     Head: Normocephalic and atraumatic.     Mouth/Throat:     Mouth: Mucous membranes are moist.  Eyes:     Extraocular Movements: Extraocular movements intact.     Pupils: Pupils are equal, round, and reactive to light.  Cardiovascular:     Rate and Rhythm: Normal rate and regular rhythm.      Heart sounds: No murmur heard.   No friction rub.  Pulmonary:     Effort: Pulmonary effort is normal. No respiratory distress.     Breath sounds: Normal breath sounds. No wheezing or rales.  Abdominal:     General: Bowel sounds are normal. There is no distension.     Palpations: Abdomen is soft.     Tenderness: There is no abdominal tenderness.  Musculoskeletal:        General: Normal range of motion.     Cervical back: Normal range of motion and neck supple.  Skin:    General: Skin is warm and dry.  Neurological:     General: No focal deficit present.     Mental Status: He is alert and oriented to person, place, and time.     Cranial Nerves: No cranial nerve deficit.     Motor: No weakness.     Coordination: Coordination normal.    ED Results / Procedures / Treatments   Labs (all labs ordered are listed, but only abnormal results are displayed) Labs Reviewed  URINALYSIS, ROUTINE W REFLEX MICROSCOPIC  BASIC METABOLIC PANEL  CBC WITH DIFFERENTIAL/PLATELET    EKG EKG Interpretation  Date/Time:  Tuesday April 06 2021 00:20:35 EDT Ventricular Rate:  59 PR Interval:  194 QRS Duration: 101 QT Interval:  415 QTC Calculation: 412 R Axis:   26 Text Interpretation: Sinus rhythm Low voltage, extremity leads ST elev, probable normal early repol pattern Confirmed by Geoffery Lyons ([id number]) on 04/06/2021 1:59:40 AM  Radiology No results found.  Procedures Procedures   Medications Ordered in ED Medications  sodium chloride 0.9 % bolus 1,000 mL (has no administration in time range)    ED Course  I have reviewed the triage vital signs and the nursing notes.  Pertinent labs & imaging results that were available during my care of the patient were reviewed by me and considered in my medical decision making (see chart for details).    MDM Rules/Calculators/A&P  Patient presenting here with complaints of weakness, headache, and low back..  This has been present for the past  several days.  I am uncertain as to the exact etiology of his symptoms, but his laboratory studies and vital signs are unremarkable.  He was concerned that he had elevated blood pressure at home, but it is 122/85 here.  He is neurologically intact and abdominal exam is benign.  I see no indication for imaging studies.  Patient to be discharged with anti-inflammatory medications, rest, and return as needed.  Final Clinical Impression(s) / ED Diagnoses Final diagnoses:  None    Rx / DC Orders ED Discharge Orders     None  Geoffery Lyons, MD 04/06/21 Helene Shoe    Geoffery Lyons, MD 04/06/21 [phone]

## 2021-04-05 NOTE — ED Triage Notes (Signed)
Pt c/o bilateral flank pain, headache and feels like his head is "off".

## 2021-04-06 LAB — BASIC METABOLIC PANEL
Anion gap: 8 (ref 5–15)
BUN: 17 mg/dL (ref 6–20)
CO2: 25 mmol/L (ref 22–32)
Calcium: 9.1 mg/dL (ref 8.9–10.3)
Chloride: 101 mmol/L (ref 98–111)
Creatinine, Ser: 1.29 mg/dL — ABNORMAL HIGH (ref 0.61–1.24)
GFR, Estimated: >60 mL/min (ref >60)
Glucose, Bld: 115 mg/dL — ABNORMAL HIGH (ref 70–99)
Potassium: 4.1 mmol/L (ref 3.5–5.1)
Sodium: 134 mmol/L — ABNORMAL LOW (ref 135–145)

## 2021-04-06 LAB — CBC WITH DIFFERENTIAL/PLATELET
Abs Immature Granulocytes: 0.08 10*3/uL — ABNORMAL HIGH (ref 0.00–0.07)
Basophils Absolute: 0 10*3/uL (ref 0.0–0.1)
Basophils Relative: 0 %
Eosinophils Absolute: 0.2 10*3/uL (ref 0.0–0.5)
Eosinophils Relative: 2 %
HCT: 41.4 % (ref 39.0–52.0)
Hemoglobin: 13.8 g/dL (ref 13.0–17.0)
Immature Granulocytes: 1 %
Lymphocytes Relative: 35 %
Lymphs Abs: 3.8 10*3/uL (ref 0.7–4.0)
MCH: 27.6 pg (ref 26.0–34.0)
MCHC: 33.3 g/dL (ref 30.0–36.0)
MCV: 82.8 fL (ref 80.0–100.0)
Monocytes Absolute: 1 10*3/uL (ref 0.1–1.0)
Monocytes Relative: 9 %
Neutro Abs: 5.8 10*3/uL (ref 1.7–7.7)
Neutrophils Relative %: 53 %
Platelets: 335 10*3/uL (ref 150–400)
RBC: 5 MIL/uL (ref 4.22–5.81)
RDW: 12.5 % (ref 11.5–15.5)
WBC: 10.9 10*3/uL — ABNORMAL HIGH (ref 4.0–10.5)
nRBC: 0 % (ref 0.0–0.2)

## 2021-04-06 NOTE — Discharge Instructions (Addendum)
Take ibuprofen 600 mg every 6 hours as needed for pain.  Rest.  Return to the emergency department if symptoms significantly worsen or change.

## 2021-04-12 ENCOUNTER — Telehealth: Payer: Self-pay | Admitting: Cardiovascular Disease

## 2021-04-12 MED ORDER — LISINOPRIL 5 MG PO TABS: 5.0000 mg | ORAL_TABLET | Freq: Every day | ORAL | 0 refills | Status: DC

## 2021-04-12 MED ORDER — ROSUVASTATIN CALCIUM 10 MG PO TABS: 10.0000 mg | ORAL_TABLET | Freq: Every day | ORAL | 0 refills | Status: AC

## 2021-04-12 NOTE — Telephone Encounter (Signed)
*  STAT* If patient is at the pharmacy, call can be transferred to refill team.   1. Which medications need to be refilled? (please list name of each medication and dose if known)Lisinopril and Rosuvastatin  2. Which pharmacy/location (including street and city if local pharmacy) is medication to be sent to? [facility], [place]  3. Do they need a 30 day or 90 day supply? 90 days and refills

## 2021-04-12 NOTE — Telephone Encounter (Signed)
Pt's medications were sent to pt's pharmacy as requested. Confirmation received.  

## 2021-05-31 NOTE — Progress Notes (Incomplete)
Date:  05/31/2021   ID:  Philip Bruce, DOB 20-Oct-1970, MRN [id number]  PCP:  Virginia Rochester, [place]  Cardiologist:  Jenkins Rouge, MD  Electrophysiologist:  Constance Haw, MD   Referring MD: Virginia Rochester, [place]     History of Present Illness:    Philip Bruce is a 50 y.o. male with tachycardia here for follow-up  Event monitor 04/04/18 demonstrated what appears to be brief WCT with some VA dissociation. 04/04/18  He has a history of atrial tachycardia with aberrancy but this appeared different. Seen by Dr Curt Bears on  05/01/18 and given normal EF no further w/u planned Last cath was in 2016 with no CAD. He indicated flecainide as option if arrhythmia more symptomatic   No syncope, no Fevers. No CP.   TTE 06/26/19 EF 55-60% no significant valve disease   Still working at [employer] Has 48 yo graduating early wants to go to [employer] as well     Diagnosed with DM. A1c was over 10 06/2019 Improved now with A1c 5.6 03/24/21  Seen in ED with flu like symptoms 10;3;22 w/u negative BP up at home but normal in ER   ***  Past Medical History:  Diagnosis Date   CKD (chronic kidney disease), stage II    Dyslipidemia    Hyperglycemia    NICM (nonischemic cardiomyopathy) ([facility])    a. 07/2014: EF 45%, normal coronaries. ?Viral cardiomyopathy.    Past Surgical History:  Procedure Laterality Date   LEFT HEART CATHETERIZATION WITH CORONARY ANGIOGRAM N/A 07/22/2014   Procedure: LEFT HEART CATHETERIZATION WITH CORONARY ANGIOGRAM;  Surgeon: Leonie Man, MD;  Location: [facility] CATH LAB;  Service: Cardiovascular;  Laterality: N/A;    Current Medications: No outpatient medications have been marked as taking for the 06/07/21 encounter (Appointment) with Josue Hector, MD.     Allergies:   Meloxicam, Statins, and Etodolac   Social History   Socioeconomic History   Marital status: Married    Spouse name: Not on file   Number of children: Not on file   Years of education:  Not on file   Highest education level: Not on file  Occupational History   Not on file  Tobacco Use   Smoking status: Never   Smokeless tobacco: Never  Vaping Use   Vaping Use: Unknown  Substance and Sexual Activity   Alcohol use: No   Drug use: No   Sexual activity: Yes  Other Topics Concern   Not on file  Social History Narrative   Not on file   Social Determinants of Health   Financial Resource Strain: Not on file  Food Insecurity: Not on file  Transportation Needs: Not on file  Physical Activity: Not on file  Stress: Not on file  Social Connections: Not on file     Family History: The patient's family history includes Cancer in his father; Coronary artery disease (age of onset: 64) in his mother; Diabetes in his mother.  ROS:   Please see the history of present illness.    No fevers chills nausea vomiting bleeding all other systems reviewed and are negative.  EKGs/Labs/Other Studies Reviewed:    Notes EP Dr Curt Bears, TTE, Holter previous stress test 2016     EKG:  04/04/18  sinus rhythm with PACs 05/22/19 SR rate 66 Q 3,F   Recent Labs: 04/06/2021: BUN 17; Creatinine, Ser 1.29; Hemoglobin 13.8; Platelets 335; Potassium 4.1; Sodium 134  Recent Lipid Panel  Component Value Date/Time   CHOL 163 03/31/2017 1102   TRIG 109 03/31/2017 1102   HDL 48 03/31/2017 1102   CHOLHDL 3.4 03/31/2017 1102   CHOLHDL 6.1 07/22/2014 0032   VLDL 68 (H) 07/22/2014 0032   LDLCALC 93 03/31/2017 1102    Physical Exam:    VS:  There were no vitals taken for this visit.    Wt Readings from Last 3 Encounters:  04/05/21 283 lb (128.4 kg)  07/27/20 272 lb (123.4 kg)  09/25/19 274 lb (124.3 kg)    Affect appropriate Healthy:  appears stated age HEENT: normal Neck supple with no adenopathy JVP normal no bruits no thyromegaly Lungs clear with no wheezing and good diaphragmatic motion Heart:  S1/S2 no murmur, no rub, gallop or click PMI normal Abdomen: benighn, BS positve, no  tenderness, no AAA no bruit.  No HSM or HJR Distal pulses intact with no bruits No edema Neuro non-focal Skin warm and dry No muscular weakness    ASSESSMENT:    No diagnosis found. PLAN:    In order of problems listed above:  Paroxysmal atrial tachycardia - asymptomatic now TTE normal RV/LV function seen by EP no further w/u or Rx indicated   Essential hypertension -Continue with both diltiazem as well as lisinopril.  Mixed hyperlipidemia -Continue with low-dose Crestor10 mg  LDL 108 07/22/20 dose increased   DM:  - Discussed low carb diet.  Target hemoglobin A1c is 6.5 or less.  Continue current medications.   F/U in a year   Signed, Charlton Haws, MD  05/31/2021 4:06 PM    [facility]

## 2021-06-07 ENCOUNTER — Ambulatory Visit: Payer: 59 | Admitting: Cardiovascular Disease

## 2021-07-11 ENCOUNTER — Other Ambulatory Visit: Payer: Self-pay | Admitting: Cardiovascular Disease

## 2021-07-26 NOTE — Progress Notes (Signed)
Date:  07/29/2021   ID:  Philip Bruce, DOB 04-05-1971, MRN [id number]  PCP:  Virginia Rochester, [place]  Cardiologist:  Jenkins Rouge, MD  Electrophysiologist:  Constance Haw, MD   Referring MD: Virginia Rochester, [place]    History of Present Illness:    Philip Bruce is a 51 y.o. male with tachycardia here for follow-up  Event monitor 04/04/18 demonstrated what appears to be brief WCT with some VA dissociation. 04/04/18  He has a history of atrial tachycardia with aberrancy but this appeared different. Seen by Dr Curt Bears on  05/01/18 and given normal EF no further w/u planned Last cath was in 2016 with no CAD. He indicated flecainide as option if arrhythmia more symptomatic 2021 diagnosed with DM. A1c was over 10 Better on metformins He loves bread Last A1c much improved 5.6 03/24/21   No cardiac complaints Lots of joint/back issues   No syncope, no Fevers. No CP.   TTE 06/26/19 EF 55-60% no significant valve disease   Still working at [employer] Has 20 yo daughter was at [employer] but financial issues and working And 34 yo son   Weight up A1c good some muscle spasms in back   Past Medical History:  Diagnosis Date   CKD (chronic kidney disease), stage II    Dyslipidemia    Hyperglycemia    NICM (nonischemic cardiomyopathy) ([place])    a. 07/2014: EF 45%, normal coronaries. ?Viral cardiomyopathy.    Past Surgical History:  Procedure Laterality Date   LEFT HEART CATHETERIZATION WITH CORONARY ANGIOGRAM N/A 07/22/2014   Procedure: LEFT HEART CATHETERIZATION WITH CORONARY ANGIOGRAM;  Surgeon: Leonie Man, MD;  Location: [facility] CATH LAB;  Service: Cardiovascular;  Laterality: N/A;    Current Medications: Current Meds  Medication Sig   aspirin 81 MG EC tablet Take 81 mg by mouth daily.   diltiazem (CARDIZEM) 30 MG tablet Take 1 tablet (30 mg total) by mouth daily as needed (palpitations).   lisinopril (ZESTRIL) 5 MG tablet Take 1 tablet (5 mg total) by mouth daily. Pt needs to keep upcoming  appt in April for further refills   metFORMIN (GLUCOPHAGE-XR) 500 MG 24 hr tablet Take 500 mg by mouth daily.   OVER THE COUNTER MEDICATION Sterling Mega Men Energy and Metabolism - Take two (2) capsules by mouth daily. Nugenix Ultimate Advanced Free Testosterone Complex - Take two (2) capsules by mouth twice daily.   rosuvastatin (CRESTOR) 10 MG tablet Take 1 tablet (10 mg total) by mouth daily. Please keep upcoming appt in December 2022 with Dr. Johnsie Cancel before anymore refills. Thank you     Allergies:   Meloxicam, Statins, and Etodolac   Social History   Socioeconomic History   Marital status: Married    Spouse name: Not on file   Number of children: Not on file   Years of education: Not on file   Highest education level: Not on file  Occupational History   Not on file  Tobacco Use   Smoking status: Never   Smokeless tobacco: Never  Vaping Use   Vaping Use: Unknown  Substance and Sexual Activity   Alcohol use: No   Drug use: No   Sexual activity: Yes  Other Topics Concern   Not on file  Social History Narrative   Not on file   Social Determinants of Health   Financial Resource Strain: Not on file  Food Insecurity: Not on file  Transportation Needs: Not on file  Physical Activity: Not on  file  Stress: Not on file  Social Connections: Not on file     Family History: The patient's family history includes Cancer in his father; Coronary artery disease (age of onset: 75) in his mother; Diabetes in his mother.  ROS:   Please see the history of present illness.    No fevers chills nausea vomiting bleeding all other systems reviewed and are negative.  EKGs/Labs/Other Studies Reviewed:    Notes EP Dr Curt Bears, TTE, Holter previous stress test 2016     EKG:  04/04/18  sinus rhythm with PACs 05/22/19 SR rate 66 Q 3,F   Recent Labs: 04/06/2021: BUN 17; Creatinine, Ser 1.29; Hemoglobin 13.8; Platelets 335; Potassium 4.1; Sodium 134  Recent Lipid Panel    Component Value  Date/Time   CHOL 163 03/31/2017 1102   TRIG 109 03/31/2017 1102   HDL 48 03/31/2017 1102   CHOLHDL 3.4 03/31/2017 1102   CHOLHDL 6.1 07/22/2014 0032   VLDL 68 (H) 07/22/2014 0032   LDLCALC 93 03/31/2017 1102    Physical Exam:    VS:  BP 120/80    Pulse 72    Ht 6' (1.829 m)    Wt 283 lb 9.6 oz (128.6 kg)    BMI 38.46 kg/m     Wt Readings from Last 3 Encounters:  07/29/21 283 lb 9.6 oz (128.6 kg)  04/05/21 283 lb (128.4 kg)  07/27/20 272 lb (123.4 kg)    No distress No tachypnea No edema No JVP elevation    ASSESSMENT:    No diagnosis found. PLAN:    In order of problems listed above:  Paroxysmal atrial tachycardia - asymptomatic now TTE normal RV/LV function seen by EP no further w/u or Rx indicated   Essential hypertension -Continue with both diltiazem as well as lisinopril.  Mixed hyperlipidemia -Continue with low-dose Crestor 5 mg once a day. LDL was 93 with high triglycerides from poorly controlled DM  DM:  - Discussed low carb diet.  Target hemoglobin A1c is 6.5 or less.  Continue current medications. Much improved   F/U in a year    Signed, Jenkins Rouge, MD  07/29/2021 8:29 AM    [facility]

## 2021-07-29 ENCOUNTER — Other Ambulatory Visit: Payer: Self-pay

## 2021-07-29 ENCOUNTER — Encounter: Payer: Self-pay | Admitting: Cardiovascular Disease

## 2021-07-29 ENCOUNTER — Ambulatory Visit: Payer: 59 | Admitting: Cardiovascular Disease

## 2021-07-29 VITALS — BP 120/80 | HR 72 | Ht 72.0 in | Wt 283.6 lb

## 2021-07-29 DIAGNOSIS — I10 Essential (primary) hypertension: Secondary | ICD-10-CM

## 2021-07-29 DIAGNOSIS — E782 Mixed hyperlipidemia: Secondary | ICD-10-CM

## 2021-07-29 DIAGNOSIS — I4719 Other supraventricular tachycardia: Secondary | ICD-10-CM

## 2021-07-29 DIAGNOSIS — I471 Supraventricular tachycardia: Secondary | ICD-10-CM | POA: Diagnosis not present

## 2021-07-29 NOTE — Patient Instructions (Addendum)
Medication Instructions:  NO CHANGES *If you need a refill on your cardiac medications before your next appointment, please call your pharmacy*   Lab Work: NONE If you have labs (blood work) drawn today and your tests are completely normal, you will receive your results only by: MyChart Message (if you have MyChart) OR A paper copy in the mail If you have any lab test that is abnormal or we need to change your treatment, we will call you to review the results.   Testing/Procedures: NONE   Follow-Up: At [facility], you and your health needs are our priority.  As part of our continuing mission to provide you with exceptional heart care, we have created designated Provider Care Teams.  These Care Teams include your primary Cardiologist (physician) and Advanced Practice Providers (APPs -  Physician Assistants and Nurse Practitioners) who all work together to provide you with the care you need, when you need it.  We recommend signing up for the patient portal called "MyChart".  Sign up information is provided on this After Visit Summary.  MyChart is used to connect with patients for Virtual Visits (Telemedicine).  Patients are able to view lab/test results, encounter notes, upcoming appointments, etc.  Non-urgent messages can be sent to your provider as well.   To learn more about what you can do with MyChart, go to [email or web address].    Your next appointment:   1 YEAR   The format for your next appointment:   In Person  Provider:   Charlton Haws, MD     Other Instructions NONE

## 2021-10-19 ENCOUNTER — Ambulatory Visit: Payer: 59 | Admitting: Cardiovascular Disease

## 2021-10-25 ENCOUNTER — Other Ambulatory Visit: Payer: Self-pay | Admitting: Cardiovascular Disease

## 2022-07-30 ENCOUNTER — Other Ambulatory Visit: Payer: Self-pay | Admitting: Cardiovascular Disease

## 2022-08-28 ENCOUNTER — Other Ambulatory Visit: Payer: Self-pay | Admitting: Cardiovascular Disease

## 2022-08-29 NOTE — Telephone Encounter (Signed)
Rx refill sent to pharmacy. 

## 2023-03-31 NOTE — Progress Notes (Unsigned)
Date:  03/31/2023   ID:  Philip Bruce, DOB June 07, 1971, MRN [id number]  PCP:  Daylene Katayama, PA  Cardiologist:  Charlton Haws, MD  Electrophysiologist:  Regan Lemming, MD   Referring MD: Daylene Katayama, [place]    History of Present Illness:    Philip Bruce is a 52 y.o. male with tachycardia here for follow-up  Event monitor 04/04/18 demonstrated what appears to be brief WCT with some VA dissociation. 04/04/18  He has a history of atrial tachycardia with aberrancy but this appeared different. Seen by Dr Elberta Fortis on  05/01/18 and given normal EF no further w/u planned Last cath was in 2016 with no CAD. He indicated flecainide as option if arrhythmia more symptomatic 2021 diagnosed with DM. A1c was over 10 Better on metformins He loves bread Last A1c much improved 5.6 03/24/21   No cardiac complaints Lots of joint/back issues   No syncope, no Fevers. No CP.   TTE 06/26/19 EF 55-60% no significant valve disease   Still working at [employer] Has 37 yo daughter was at [facility] but financial issues and working now Also has 106 yo son   Weight up A1c good some muscle spasms in back   ***  Past Medical History:  Diagnosis Date   CKD (chronic kidney disease), stage II    Dyslipidemia    Hyperglycemia    NICM (nonischemic cardiomyopathy) ([facility])    a. 07/2014: EF 45%, normal coronaries. ?Viral cardiomyopathy.    Past Surgical History:  Procedure Laterality Date   LEFT HEART CATHETERIZATION WITH CORONARY ANGIOGRAM N/A 07/22/2014   Procedure: LEFT HEART CATHETERIZATION WITH CORONARY ANGIOGRAM;  Surgeon: Marykay Lex, MD;  Location: [facility] CATH LAB;  Service: Cardiovascular;  Laterality: N/A;    Current Medications: No outpatient medications have been marked as taking for the 04/06/23 encounter (Appointment) with Wendall Stade, MD.     Allergies:   Meloxicam, Statins, and Etodolac   Social History   Socioeconomic History   Marital status: Married    Spouse name: Not on file    Number of children: Not on file   Years of education: Not on file   Highest education level: Not on file  Occupational History   Not on file  Tobacco Use   Smoking status: Never   Smokeless tobacco: Never  Vaping Use   Vaping status: Unknown  Substance and Sexual Activity   Alcohol use: No   Drug use: No   Sexual activity: Yes  Other Topics Concern   Not on file  Social History Narrative   Not on file   Social Determinants of Health   Financial Resource Strain: Not on file  Food Insecurity: Low Risk  (09/26/2022)   Received from [facility]   Hunger Vital Sign    Worried About Running Out of Food in the Last Year: Never true    Ran Out of Food in the Last Year: Never true  Transportation Needs: Not on file (09/26/2022)  Physical Activity: Not on file  Stress: Not on file  Social Connections: Not on file     Family History: The patient's family history includes Cancer in his father; Coronary artery disease (age of onset: 32) in his mother; Diabetes in his mother.  ROS:   Please see the history of present illness.    No fevers chills nausea vomiting bleeding all other systems reviewed and are negative.  EKGs/Labs/Other Studies Reviewed:    Notes EP Dr Elberta Fortis, TTE, Holter previous  stress test 2016     EKG:  04/04/18  sinus rhythm with PACs 05/22/19 SR rate 66 Q 3,F   Recent Labs: No results found for requested labs within last 365 days.  Recent Lipid Panel    Component Value Date/Time   CHOL 163 03/31/2017 1102   TRIG 109 03/31/2017 1102   HDL 48 03/31/2017 1102   CHOLHDL 3.4 03/31/2017 1102   CHOLHDL 6.1 07/22/2014 0032   VLDL 68 (H) 07/22/2014 0032   LDLCALC 93 03/31/2017 1102    Physical Exam:    VS:  There were no vitals taken for this visit.    Wt Readings from Last 3 Encounters:  07/29/21 283 lb 9.6 oz (128.6 kg)  04/05/21 283 lb (128.4 kg)  07/27/20 272 lb (123.4 kg)    No distress No tachypnea No edema No JVP elevation    ASSESSMENT:     No diagnosis found. PLAN:    In order of problems listed above:  Paroxysmal atrial tachycardia - asymptomatic now TTE normal RV/LV function seen by EP no further w/u or Rx indicated   Essential hypertension -Continue with both diltiazem as well as lisinopril.  Mixed hyperlipidemia -Continue with low-dose Crestor 5 mg once a day. LDL was 93 with high triglycerides from poorly controlled DM  DM:  - Discussed low carb diet.  Target hemoglobin A1c is 6.5 or less.  Continue current medications. Much improved   F/U in a year    Signed, Charlton Haws, MD  03/31/2023 11:07 AM    [facility]

## 2023-04-06 ENCOUNTER — Encounter: Payer: Self-pay | Admitting: Cardiovascular Disease

## 2023-04-06 ENCOUNTER — Ambulatory Visit: Payer: 59 | Attending: Cardiovascular Disease | Admitting: Cardiovascular Disease

## 2023-04-06 VITALS — BP 120/84 | HR 69 | Ht 72.0 in | Wt 276.0 lb

## 2023-04-06 DIAGNOSIS — I10 Essential (primary) hypertension: Secondary | ICD-10-CM | POA: Diagnosis not present

## 2023-04-06 DIAGNOSIS — E782 Mixed hyperlipidemia: Secondary | ICD-10-CM | POA: Diagnosis not present

## 2023-04-06 DIAGNOSIS — L700 Acne vulgaris: Secondary | ICD-10-CM | POA: Diagnosis not present

## 2023-04-06 DIAGNOSIS — I4719 Other supraventricular tachycardia: Secondary | ICD-10-CM | POA: Diagnosis not present

## 2023-04-06 NOTE — Patient Instructions (Addendum)
Medication Instructions:  Your physician recommends that you continue on your current medications as directed. Please refer to the Current Medication list given to you today.  *If you need a refill on your cardiac medications before your next appointment, please call your pharmacy*  Lab Work: If you have labs (blood work) drawn today and your tests are completely normal, you will receive your results only by: MyChart Message (if you have MyChart) OR A paper copy in the mail If you have any lab test that is abnormal or we need to change your treatment, we will call you to review the results.  Testing/Procedures: None ordered today.  Follow-Up: At [facility], you and your health needs are our priority.  As part of our continuing mission to provide you with exceptional heart care, we have created designated Provider Care Teams.  These Care Teams include your primary Cardiologist (physician) and Advanced Practice Providers (APPs -  Physician Assistants and Nurse Practitioners) who all work together to provide you with the care you need, when you need it.  We recommend signing up for the patient portal called "MyChart".  Sign up information is provided on this After Visit Summary.  MyChart is used to connect with patients for Virtual Visits (Telemedicine).  Patients are able to view lab/test results, encounter notes, upcoming appointments, etc.  Non-urgent messages can be sent to your provider as well.   To learn more about what you can do with MyChart, go to [email or web address].    Your next appointment:   1 year(s)  Provider:   Charlton Haws, MD     You have been referred to Dermatology.

## 2023-04-19 ENCOUNTER — Other Ambulatory Visit: Payer: Self-pay

## 2023-04-19 DIAGNOSIS — L700 Acne vulgaris: Secondary | ICD-10-CM

## 2023-04-19 NOTE — Progress Notes (Signed)
Ordered another referral for dermatology, since previous referral cannot see unless cancer related.

## 2023-08-08 ENCOUNTER — Telehealth: Payer: Self-pay

## 2023-08-08 DIAGNOSIS — L700 Acne vulgaris: Secondary | ICD-10-CM

## 2023-08-08 NOTE — Telephone Encounter (Signed)
 Gretel Arland CHRISTELLA Drena Noel Sharlet, RN I cannot change the referral.  You will have to put in a new referral.       Previous Messages    ----- Message ----- From: Drena Noel Sharlet, RN Sent: 08/03/2023   5:33 PM EST To: Arland CHRISTELLA Price Subject: RE: Referral to dermatology                    You can change it to any dermatologist ----- Message ----- From: Gretel Arland CHRISTELLA Sent: 08/03/2023   9:28 AM EST To: Sharlet Drena Noel, RN Subject: Referral to dermatology                        FYI,  Called to check on the status of this referral.  [facility] has over 1000 in their workqueue.  This pt still has not been scheduled.  Let me know if you want to wait for an appt with this provider or change referral to another dermatology practice.   Placed new referral due to status of referral.

## 2024-07-01 NOTE — Progress Notes (Signed)
 "     Date:  07/09/2024   ID:  Philip Bruce, DOB [birth year], MRN [id number]  PCP:  Beverlie Crazier, PA  Cardiologist:  Maude Emmer, MD  Electrophysiologist:  Soyla Gladis Norton, MD   Referring MD: Beverlie Crazier, [place]    History of Present Illness:    Philip Bruce is a 53 y.o. male with tachycardia here for follow-up  Event monitor 04/04/18 demonstrated what appears to be brief WCT with some VA dissociation. 04/04/18  He has a history of atrial tachycardia with aberrancy but this appeared different. Seen by Dr Norton on  05/01/18 and given normal EF no further w/u planned Last cath was in 2016 with no CAD. He indicated flecainide as option if arrhythmia more symptomatic 2021 diagnosed with DM. A1c was over 10 Better on metformins He loves bread Last A1c much improved 5.6 03/24/21   No cardiac complaints Lots of joint/back issues   No syncope, no Fevers. No CP.   TTE 06/26/19 EF 55-60% no significant valve disease   Still working at [employer] Has 26 yo daughter was at [employer] but financial issues and working now Also has 59 yo son Mom;s house in in [place]ate She died 2 years ago and he is trying to get niece out of it  Weight up A1c good some muscle spasms in back Wants to see a dermatologist for papular lesions on back of head/neck. He thinks they are from infected clippers from bad haircut years ago Seems to be taking a long time to get in with dermatology Work related stress Non compliant with meds at times Now on Zepbound for DM   Has to get a new primary and ran out of meds latter part of 2-25. He is taking Mars Men supplements I warned him about  Ashwagandha can raise BP and cause palpitations     Past Medical History:  Diagnosis Date   CKD (chronic kidney disease), stage II    Dyslipidemia    Hyperglycemia    NICM (nonischemic cardiomyopathy) (HCC)    a. 07/2014: EF 45%, normal coronaries. ?Viral cardiomyopathy.    Past Surgical History:  Procedure Laterality Date   LEFT HEART  CATHETERIZATION WITH CORONARY ANGIOGRAM N/A 07/22/2014   Procedure: LEFT HEART CATHETERIZATION WITH CORONARY ANGIOGRAM;  Surgeon: Alm LELON Clay, MD;  Location: [facility] CATH LAB;  Service: Cardiovascular;  Laterality: N/A;    Current Medications: Current Meds  Medication Sig   aspirin  81 MG EC tablet Take 81 mg by mouth daily.   diltiazem  (CARDIZEM ) 30 MG tablet Take 1 tablet (30 mg total) by mouth daily as needed (palpitations).   lisinopril  (ZESTRIL ) 5 MG tablet Take 1 tablet (5 mg total) by mouth daily.   metFORMIN (GLUCOPHAGE-XR) 500 MG 24 hr tablet Take 1,000 mg by mouth 2 (two) times daily with a meal.   MOUNJARO 2.5 MG/0.5ML Pen Inject 2.5 mg into the skin once a week.   OVER THE COUNTER MEDICATION GNC Mega Men Energy and Metabolism - Take two (2) capsules by mouth daily. Nugenix Ultimate Advanced Free Testosterone Complex - Take two (2) capsules by mouth twice daily.   rosuvastatin  (CRESTOR ) 10 MG tablet Take 1 tablet (10 mg total) by mouth daily. Please keep upcoming appt in December 2022 with Dr. Emmer before anymore refills. Thank you     Allergies:   Meloxicam, Etodolac, and Statins   Social History   Socioeconomic History   Marital status: Married    Spouse name: Not on file   Number  of children: Not on file   Years of education: Not on file   Highest education level: Not on file  Occupational History   Not on file  Tobacco Use   Smoking status: Never   Smokeless tobacco: Never  Vaping Use   Vaping status: Unknown  Substance and Sexual Activity   Alcohol use: No   Drug use: No   Sexual activity: Yes  Other Topics Concern   Not on file  Social History Narrative   Not on file   Social Drivers of Health   Tobacco Use: Low Risk (07/09/2024)   Patient History    Smoking Tobacco Use: Never    Smokeless Tobacco Use: Never    Passive Exposure: Not on file  Financial Resource Strain: Not on file  Food Insecurity: Low Risk (04/26/2023)   Received from [facility]    Epic    Within the past 12 months, you worried that your food would run out before you got money to buy more: Never true    Within the past 12 months, the food you bought just didn't last and you didn't have money to get more. : Never true  Transportation Needs: No Transportation Needs (04/26/2023)   Received from [facility]    In the past 12 months, has lack of reliable transportation kept you from medical appointments, meetings, work or from getting things needed for daily living? : No  Physical Activity: Not on file  Stress: Not on file  Social Connections: Not on file  Depression (EYV7-0): Not on file  Alcohol Screen: Not on file  Housing: Low Risk (04/26/2023)   Received from [facility]   Epic    What is your living situation today?: I have a steady place to live    Think about the place you live. Do you have problems with any of the following? Choose all that apply:: None/None on this list  Utilities: Low Risk (04/26/2023)   Received from [facility]   Utilities    In the past 12 months has the electric, gas, oil, or water company threatened to shut off services in your home? : No  Health Literacy: Not on file     Family History: The patient's family history includes Cancer in his father; Coronary artery disease (age of onset: 64) in his mother; Diabetes in his mother.  ROS:   Please see the history of present illness.    No fevers chills nausea vomiting bleeding all other systems reviewed and are negative.  EKGs/Labs/Other Studies Reviewed:    Notes EP Dr Inocencio, TTE, Holter previous stress test 2016     EKG:  04/04/18  sinus rhythm with PACs 05/22/19 SR rate 66 Q 3,F   Recent Labs: No results found for requested labs within last 365 days.  Recent Lipid Panel    Component Value Date/Time   CHOL 163 03/31/2017 1102   TRIG 109 03/31/2017 1102   HDL 48 03/31/2017 1102   CHOLHDL 3.4 03/31/2017 1102   CHOLHDL 6.1 07/22/2014 0032   VLDL 68 (H)  07/22/2014 0032   LDLCALC 93 03/31/2017 1102    Physical Exam:    VS:  BP 118/68   Pulse 72   Ht 6' (1.829 m)   Wt 270 lb 14.4 oz (122.9 kg)   SpO2 97%   BMI 36.74 kg/m     Wt Readings from Last 3 Encounters:  07/09/24 270 lb 14.4 oz (122.9 kg)  04/06/23 276 lb (125.2  kg)  07/29/21 283 lb 9.6 oz (128.6 kg)    No distress No tachypnea No edema No JVP elevation    ASSESSMENT:    1. PAT (paroxysmal atrial tachycardia)   2. Primary hypertension     PLAN:    In order of problems listed above:  Paroxysmal atrial tachycardia - asymptomatic now TTE normal RV/LV function seen by EP no further w/u or Rx indicated   Essential hypertension -Continue with both diltiazem  as well as lisinopril .  Mixed hyperlipidemia -Continue with low-dose Crestor  5 mg once a day. LDL was 93 with high triglycerides from poorly controlled DM  DM:  - Discussed low carb diet.  Target hemoglobin A1c is 6.5 or less.  Continue current medications. Much improved On Zepbound now   Dermatology:  papular lesions on neck refer to dermatiology  Seems to be a long wait time Has appointment next month   F/U in a year    Signed, Maude Emmer, MD  07/09/2024 9:19 AM    [facility] "

## 2024-07-09 ENCOUNTER — Encounter: Payer: Self-pay | Admitting: Cardiovascular Disease

## 2024-07-09 ENCOUNTER — Ambulatory Visit: Attending: Cardiovascular Disease | Admitting: Cardiovascular Disease

## 2024-07-09 VITALS — BP 118/68 | HR 72 | Ht 72.0 in | Wt 270.9 lb

## 2024-07-09 DIAGNOSIS — E782 Mixed hyperlipidemia: Secondary | ICD-10-CM | POA: Diagnosis not present

## 2024-07-09 DIAGNOSIS — I10 Essential (primary) hypertension: Secondary | ICD-10-CM

## 2024-07-09 DIAGNOSIS — I4719 Other supraventricular tachycardia: Secondary | ICD-10-CM | POA: Diagnosis not present

## 2024-07-09 NOTE — Patient Instructions (Signed)
 Medication Instructions:  No Changes *If you need a refill on your cardiac medications before your next appointment, please call your pharmacy*  Lab Work: None  Follow-Up: At [facility], you and your health needs are our priority.  As part of our continuing mission to provide you with exceptional heart care, our providers are all part of one team.  This team includes your primary Cardiologist (physician) and Advanced Practice Providers or APPs (Physician Assistants and Nurse Practitioners) who all work together to provide you with the care you need, when you need it.  Your next appointment:   1 year(s)  Provider:   Maude Emmer, MD

## 2024-08-22 ENCOUNTER — Ambulatory Visit: Admitting: Dermatology
